# Patient Record
Sex: Female | Born: 1935 | Race: White | Hispanic: No
Health system: Southern US, Community
[De-identification: ages and names within clinical notes are randomized; demographics above are authoritative.]

---

## 2004-08-09 ENCOUNTER — Ambulatory Visit: Payer: Self-pay | Admitting: Family Medicine

## 2005-09-20 ENCOUNTER — Other Ambulatory Visit: Payer: Self-pay

## 2005-09-20 ENCOUNTER — Emergency Department: Payer: Self-pay | Admitting: Emergency Medicine

## 2006-02-12 ENCOUNTER — Ambulatory Visit: Payer: Self-pay | Admitting: Family Medicine

## 2007-06-26 ENCOUNTER — Ambulatory Visit: Payer: Self-pay | Admitting: Family Medicine

## 2009-05-04 ENCOUNTER — Ambulatory Visit: Payer: Self-pay | Admitting: Family Medicine

## 2011-05-01 ENCOUNTER — Inpatient Hospital Stay: Payer: Self-pay | Admitting: Specialist

## 2011-05-10 ENCOUNTER — Ambulatory Visit: Payer: Self-pay | Admitting: Oncology

## 2011-05-19 ENCOUNTER — Ambulatory Visit: Payer: Self-pay | Admitting: Oncology

## 2011-06-10 ENCOUNTER — Ambulatory Visit: Payer: Self-pay | Admitting: Oncology

## 2011-06-24 ENCOUNTER — Inpatient Hospital Stay: Payer: Self-pay | Admitting: Internal Medicine

## 2011-07-10 ENCOUNTER — Ambulatory Visit: Payer: Self-pay | Admitting: Oncology

## 2011-08-10 ENCOUNTER — Ambulatory Visit: Payer: Self-pay | Admitting: Oncology

## 2011-09-09 ENCOUNTER — Ambulatory Visit: Payer: Self-pay | Admitting: Oncology

## 2011-10-10 ENCOUNTER — Ambulatory Visit: Payer: Self-pay | Admitting: Oncology

## 2011-10-12 LAB — COMPREHENSIVE METABOLIC PANEL
Albumin: 4 g/dL (ref 3.4–5.0)
Alkaline Phosphatase: 82 U/L (ref 50–136)
BUN: 14 mg/dL (ref 7–18)
Calcium, Total: 8.9 mg/dL (ref 8.5–10.1)
Osmolality: 287 (ref 275–301)
Potassium: 4.1 mmol/L (ref 3.5–5.1)
SGOT(AST): 17 U/L (ref 15–37)
SGPT (ALT): 20 U/L
Sodium: 143 mmol/L (ref 136–145)
Total Protein: 7.1 g/dL (ref 6.4–8.2)

## 2011-10-12 LAB — CBC CANCER CENTER
Basophil #: 0 x10 3/mm (ref 0.0–0.1)
Basophil %: 0.7 %
HCT: 34.9 % — ABNORMAL LOW (ref 35.0–47.0)
HGB: 11.9 g/dL — ABNORMAL LOW (ref 12.0–16.0)
Lymphocyte %: 13.7 %
MCH: 32.3 pg (ref 26.0–34.0)
MCHC: 34.2 g/dL (ref 32.0–36.0)
Monocyte %: 4.7 %
Neutrophil #: 4.1 x10 3/mm (ref 1.4–6.5)
Platelet: 197 x10 3/mm (ref 150–440)
RBC: 3.69 10*6/uL — ABNORMAL LOW (ref 3.80–5.20)

## 2011-10-19 LAB — CBC CANCER CENTER
Basophil #: 0 x10 3/mm (ref 0.0–0.1)
Basophil %: 0.1 %
Eosinophil #: 0 x10 3/mm (ref 0.0–0.7)
Eosinophil %: 0 %
HCT: 35.4 % (ref 35.0–47.0)
HGB: 12.2 g/dL (ref 12.0–16.0)
Lymphocyte #: 0.4 x10 3/mm — ABNORMAL LOW (ref 1.0–3.6)
MCH: 32.2 pg (ref 26.0–34.0)
MCHC: 34.3 g/dL (ref 32.0–36.0)
Monocyte #: 0 x10 3/mm (ref 0.0–0.7)
Neutrophil #: 6.2 x10 3/mm (ref 1.4–6.5)
Neutrophil %: 93.4 %
RBC: 3.78 10*6/uL — ABNORMAL LOW (ref 3.80–5.20)
RDW: 15.9 % — ABNORMAL HIGH (ref 11.5–14.5)
WBC: 6.6 x10 3/mm (ref 3.6–11.0)

## 2011-10-26 LAB — BASIC METABOLIC PANEL
Anion Gap: 14 (ref 7–16)
Calcium, Total: 9.4 mg/dL (ref 8.5–10.1)
Chloride: 100 mmol/L (ref 98–107)
Co2: 25 mmol/L (ref 21–32)
Osmolality: 284 (ref 275–301)
Potassium: 3.9 mmol/L (ref 3.5–5.1)

## 2011-10-26 LAB — CBC CANCER CENTER
Basophil %: 0.2 %
Eosinophil #: 0 x10 3/mm (ref 0.0–0.7)
Eosinophil %: 0 %
HGB: 12.5 g/dL (ref 12.0–16.0)
Lymphocyte %: 6.7 %
MCH: 32.1 pg (ref 26.0–34.0)
MCHC: 34.3 g/dL (ref 32.0–36.0)
Monocyte #: 0.1 x10 3/mm (ref 0.0–0.7)
Monocyte %: 0.9 %
Neutrophil %: 92.2 %
RBC: 3.88 10*6/uL (ref 3.80–5.20)
WBC: 7 x10 3/mm (ref 3.6–11.0)

## 2011-10-31 LAB — PROT IMMUNOELECTROPHORES(ARMC)

## 2011-11-10 ENCOUNTER — Ambulatory Visit: Payer: Self-pay | Admitting: Oncology

## 2011-11-22 LAB — CBC CANCER CENTER
Basophil #: 0 x10 3/mm (ref 0.0–0.1)
Eosinophil #: 0 x10 3/mm (ref 0.0–0.7)
HGB: 12.4 g/dL (ref 12.0–16.0)
Lymphocyte #: 0.4 x10 3/mm — ABNORMAL LOW (ref 1.0–3.6)
Lymphocyte %: 7.5 %
MCHC: 33.8 g/dL (ref 32.0–36.0)
Monocyte #: 0.1 x10 3/mm (ref 0.0–0.7)
Monocyte %: 1.2 %
Neutrophil %: 91.1 %
RDW: 13.9 % (ref 11.5–14.5)
WBC: 5.8 x10 3/mm (ref 3.6–11.0)

## 2011-11-22 LAB — COMPREHENSIVE METABOLIC PANEL
Alkaline Phosphatase: 51 U/L (ref 50–136)
Bilirubin,Total: 0.4 mg/dL (ref 0.2–1.0)
Chloride: 105 mmol/L (ref 98–107)
Co2: 24 mmol/L (ref 21–32)
Creatinine: 0.95 mg/dL (ref 0.60–1.30)
EGFR (Non-African Amer.): >60
Osmolality: 287 (ref 275–301)
SGPT (ALT): 22 U/L
Sodium: 140 mmol/L (ref 136–145)

## 2011-11-23 LAB — PROT IMMUNOELECTROPHORES(ARMC)

## 2011-12-06 LAB — CBC CANCER CENTER
Basophil #: 0 x10 3/mm (ref 0.0–0.1)
Basophil %: 0.2 %
Eosinophil #: 0 x10 3/mm (ref 0.0–0.7)
HCT: 35.7 % (ref 35.0–47.0)
HGB: 12.3 g/dL (ref 12.0–16.0)
Lymphocyte #: 0.7 x10 3/mm — ABNORMAL LOW (ref 1.0–3.6)
Lymphocyte %: 8.9 %
MCH: 31.9 pg (ref 26.0–34.0)
MCV: 93 fL (ref 80–100)
Monocyte #: 0.1 x10 3/mm (ref 0.0–0.7)
Monocyte %: 1.2 %
Neutrophil %: 89.5 %
Platelet: 178 x10 3/mm (ref 150–440)
RBC: 3.84 10*6/uL (ref 3.80–5.20)

## 2011-12-08 ENCOUNTER — Ambulatory Visit: Payer: Self-pay | Admitting: Oncology

## 2011-12-20 LAB — CBC CANCER CENTER
Basophil %: 0.1 %
Eosinophil %: 0.2 %
HCT: 35.5 % (ref 35.0–47.0)
Lymphocyte #: 0.6 x10 3/mm — ABNORMAL LOW (ref 1.0–3.6)
MCH: 32.1 pg (ref 26.0–34.0)
MCV: 93 fL (ref 80–100)
Monocyte #: 0.1 x10 3/mm (ref 0.0–0.7)
Monocyte %: 1.6 %
Neutrophil #: 6.6 x10 3/mm — ABNORMAL HIGH (ref 1.4–6.5)
Platelet: 178 x10 3/mm (ref 150–440)
RBC: 3.83 10*6/uL (ref 3.80–5.20)
RDW: 13.9 % (ref 11.5–14.5)

## 2011-12-20 LAB — COMPREHENSIVE METABOLIC PANEL
Alkaline Phosphatase: 64 U/L (ref 50–136)
Anion Gap: 11 (ref 7–16)
BUN: 21 mg/dL — ABNORMAL HIGH (ref 7–18)
Co2: 25 mmol/L (ref 21–32)
Creatinine: 1.1 mg/dL (ref 0.60–1.30)
EGFR (African American): >60
EGFR (Non-African Amer.): 51 — ABNORMAL LOW
SGOT(AST): 22 U/L (ref 15–37)
SGPT (ALT): 28 U/L
Sodium: 138 mmol/L (ref 136–145)
Total Protein: 7.9 g/dL (ref 6.4–8.2)

## 2012-01-08 ENCOUNTER — Ambulatory Visit: Payer: Self-pay | Admitting: Oncology

## 2012-01-15 ENCOUNTER — Ambulatory Visit: Payer: Self-pay | Admitting: Family Medicine

## 2012-01-17 ENCOUNTER — Ambulatory Visit: Payer: Self-pay | Admitting: Oncology

## 2012-01-17 LAB — CBC CANCER CENTER
Basophil %: 0.2 %
Eosinophil #: 0 x10 3/mm (ref 0.0–0.7)
Eosinophil %: 0.2 %
HCT: 33.1 % — ABNORMAL LOW (ref 35.0–47.0)
HGB: 11.3 g/dL — ABNORMAL LOW (ref 12.0–16.0)
Lymphocyte #: 0.7 x10 3/mm — ABNORMAL LOW (ref 1.0–3.6)
MCV: 93 fL (ref 80–100)
Monocyte %: 1.9 %
Neutrophil #: 6.2 x10 3/mm (ref 1.4–6.5)
Neutrophil %: 88 %
RBC: 3.54 10*6/uL — ABNORMAL LOW (ref 3.80–5.20)
RDW: 14 % (ref 11.5–14.5)

## 2012-01-17 LAB — COMPREHENSIVE METABOLIC PANEL
Alkaline Phosphatase: 78 U/L (ref 50–136)
Anion Gap: 14 (ref 7–16)
Calcium, Total: 8.9 mg/dL (ref 8.5–10.1)
Chloride: 104 mmol/L (ref 98–107)
Co2: 21 mmol/L (ref 21–32)
Creatinine: 1.22 mg/dL (ref 0.60–1.30)
EGFR (African American): 55 — ABNORMAL LOW
EGFR (Non-African Amer.): 46 — ABNORMAL LOW
Glucose: 154 mg/dL — ABNORMAL HIGH (ref 65–99)
Osmolality: 285 (ref 275–301)
SGOT(AST): 22 U/L (ref 15–37)
SGPT (ALT): 25 U/L
Total Protein: 8.2 g/dL (ref 6.4–8.2)

## 2012-01-18 LAB — PROT IMMUNOELECTROPHORES(ARMC)

## 2012-01-18 LAB — KAPPA/LAMBDA FREE LIGHT CHAINS (ARMC)

## 2012-01-22 LAB — PROT IMMUNOELECT,UR-24HR

## 2012-02-07 ENCOUNTER — Ambulatory Visit: Payer: Self-pay | Admitting: Oncology

## 2012-02-15 LAB — BASIC METABOLIC PANEL
Anion Gap: 12 (ref 7–16)
BUN: 29 mg/dL — ABNORMAL HIGH (ref 7–18)
Calcium, Total: 8.1 mg/dL — ABNORMAL LOW (ref 8.5–10.1)
Creatinine: 1.78 mg/dL — ABNORMAL HIGH (ref 0.60–1.30)
EGFR (African American): 32 — ABNORMAL LOW
Glucose: 101 mg/dL — ABNORMAL HIGH (ref 65–99)
Osmolality: 284 (ref 275–301)

## 2012-02-15 LAB — CBC CANCER CENTER
Basophil #: 0 x10 3/mm (ref 0.0–0.1)
Basophil %: 0.6 %
HCT: 29.7 % — ABNORMAL LOW (ref 35.0–47.0)
MCH: 31.5 pg (ref 26.0–34.0)
Monocyte %: 10.3 %
Neutrophil #: 2.5 x10 3/mm (ref 1.4–6.5)
RDW: 14.6 % — ABNORMAL HIGH (ref 11.5–14.5)
WBC: 4.7 x10 3/mm (ref 3.6–11.0)

## 2012-02-20 LAB — BASIC METABOLIC PANEL
Anion Gap: 12 (ref 7–16)
BUN: 26 mg/dL — ABNORMAL HIGH (ref 7–18)
Calcium, Total: 8.7 mg/dL (ref 8.5–10.1)
Co2: 24 mmol/L (ref 21–32)
Creatinine: 1.58 mg/dL — ABNORMAL HIGH (ref 0.60–1.30)
EGFR (African American): 37 — ABNORMAL LOW
Osmolality: 285 (ref 275–301)
Sodium: 140 mmol/L (ref 136–145)

## 2012-02-20 LAB — CBC CANCER CENTER
Basophil #: 0 x10 3/mm (ref 0.0–0.1)
Eosinophil #: 0.1 x10 3/mm (ref 0.0–0.7)
Lymphocyte #: 1.2 x10 3/mm (ref 1.0–3.6)
Lymphocyte %: 27.3 %
MCH: 31.7 pg (ref 26.0–34.0)
Monocyte #: 0.5 x10 3/mm (ref 0.2–0.9)
Monocyte %: 10.5 %
Neutrophil #: 2.6 x10 3/mm (ref 1.4–6.5)
RBC: 3.27 10*6/uL — ABNORMAL LOW (ref 3.80–5.20)
WBC: 4.4 x10 3/mm (ref 3.6–11.0)

## 2012-02-27 LAB — CBC CANCER CENTER
Eosinophil #: 0.1 x10 3/mm (ref 0.0–0.7)
HCT: 29.4 % — ABNORMAL LOW (ref 35.0–47.0)
HGB: 9.8 g/dL — ABNORMAL LOW (ref 12.0–16.0)
Lymphocyte #: 1.5 x10 3/mm (ref 1.0–3.6)
MCH: 31.5 pg (ref 26.0–34.0)
MCHC: 33.4 g/dL (ref 32.0–36.0)
MCV: 94 fL (ref 80–100)
Monocyte %: 11.4 %
Neutrophil #: 2.3 x10 3/mm (ref 1.4–6.5)
Neutrophil %: 52.3 %
RBC: 3.12 10*6/uL — ABNORMAL LOW (ref 3.80–5.20)
RDW: 14.7 % — ABNORMAL HIGH (ref 11.5–14.5)
WBC: 4.5 x10 3/mm (ref 3.6–11.0)

## 2012-03-05 LAB — CBC CANCER CENTER
Basophil #: 0 x10 3/mm (ref 0.0–0.1)
Eosinophil #: 0.1 x10 3/mm (ref 0.0–0.7)
Eosinophil %: 1.2 %
HCT: 30.4 % — ABNORMAL LOW (ref 35.0–47.0)
HGB: 10.2 g/dL — ABNORMAL LOW (ref 12.0–16.0)
Lymphocyte %: 14.1 %
MCH: 31.8 pg (ref 26.0–34.0)
MCHC: 33.6 g/dL (ref 32.0–36.0)
Platelet: 128 x10 3/mm — ABNORMAL LOW (ref 150–440)
RDW: 15 % — ABNORMAL HIGH (ref 11.5–14.5)

## 2012-03-05 LAB — BASIC METABOLIC PANEL
Anion Gap: 11 (ref 7–16)
BUN: 25 mg/dL — ABNORMAL HIGH (ref 7–18)
Calcium, Total: 9.1 mg/dL (ref 8.5–10.1)
Chloride: 104 mmol/L (ref 98–107)
Creatinine: 1.27 mg/dL (ref 0.60–1.30)
EGFR (African American): 48 — ABNORMAL LOW
EGFR (Non-African Amer.): 41 — ABNORMAL LOW
Sodium: 139 mmol/L (ref 136–145)

## 2012-03-09 ENCOUNTER — Ambulatory Visit: Payer: Self-pay | Admitting: Oncology

## 2012-03-12 LAB — CBC CANCER CENTER
Basophil %: 0.3 %
Eosinophil #: 0 x10 3/mm (ref 0.0–0.7)
HCT: 30.3 % — ABNORMAL LOW (ref 35.0–47.0)
Lymphocyte #: 0.6 x10 3/mm — ABNORMAL LOW (ref 1.0–3.6)
MCHC: 33.5 g/dL (ref 32.0–36.0)
MCV: 96 fL (ref 80–100)
Platelet: 148 x10 3/mm — ABNORMAL LOW (ref 150–440)
RBC: 3.15 10*6/uL — ABNORMAL LOW (ref 3.80–5.20)

## 2012-03-19 LAB — CBC CANCER CENTER
Basophil #: 0 x10 3/mm (ref 0.0–0.1)
Basophil %: 0.4 %
Eosinophil #: 0 x10 3/mm (ref 0.0–0.7)
Eosinophil %: 0.7 %
HCT: 30.8 % — ABNORMAL LOW (ref 35.0–47.0)
HGB: 10.4 g/dL — ABNORMAL LOW (ref 12.0–16.0)
Lymphocyte #: 0.5 x10 3/mm — ABNORMAL LOW (ref 1.0–3.6)
Lymphocyte %: 9.9 %
MCH: 32.5 pg (ref 26.0–34.0)
MCHC: 33.7 g/dL (ref 32.0–36.0)
MCV: 97 fL (ref 80–100)
Monocyte #: 0.1 x10 3/mm — ABNORMAL LOW (ref 0.2–0.9)
Neutrophil #: 4.6 x10 3/mm (ref 1.4–6.5)
RDW: 15.7 % — ABNORMAL HIGH (ref 11.5–14.5)
WBC: 5.3 x10 3/mm (ref 3.6–11.0)

## 2012-03-19 LAB — BASIC METABOLIC PANEL
BUN: 26 mg/dL — ABNORMAL HIGH (ref 7–18)
Calcium, Total: 9.6 mg/dL (ref 8.5–10.1)
Co2: 22 mmol/L (ref 21–32)
EGFR (African American): 47 — ABNORMAL LOW
EGFR (Non-African Amer.): 41 — ABNORMAL LOW
Sodium: 138 mmol/L (ref 136–145)

## 2012-03-26 LAB — CBC CANCER CENTER
Basophil #: 0 x10 3/mm (ref 0.0–0.1)
Eosinophil #: 0 x10 3/mm (ref 0.0–0.7)
Lymphocyte %: 9.6 %
MCHC: 33.4 g/dL (ref 32.0–36.0)
MCV: 97 fL (ref 80–100)
Monocyte %: 1.7 %
Neutrophil #: 5.6 x10 3/mm (ref 1.4–6.5)
Platelet: 167 x10 3/mm (ref 150–440)
RBC: 3.19 10*6/uL — ABNORMAL LOW (ref 3.80–5.20)
WBC: 6.3 x10 3/mm (ref 3.6–11.0)

## 2012-04-01 LAB — CBC CANCER CENTER
Basophil #: 0 x10 3/mm (ref 0.0–0.1)
Basophil %: 0.5 %
HCT: 30.4 % — ABNORMAL LOW (ref 35.0–47.0)
HGB: 9.9 g/dL — ABNORMAL LOW (ref 12.0–16.0)
MCHC: 32.7 g/dL (ref 32.0–36.0)
Monocyte %: 11.8 %
Neutrophil %: 59.8 %
RDW: 15.6 % — ABNORMAL HIGH (ref 11.5–14.5)
WBC: 4.6 x10 3/mm (ref 3.6–11.0)

## 2012-04-01 LAB — COMPREHENSIVE METABOLIC PANEL
Alkaline Phosphatase: 98 U/L (ref 50–136)
BUN: 30 mg/dL — ABNORMAL HIGH (ref 7–18)
Bilirubin,Total: 0.4 mg/dL (ref 0.2–1.0)
Calcium, Total: 9.2 mg/dL (ref 8.5–10.1)
EGFR (African American): 51 — ABNORMAL LOW
Glucose: 128 mg/dL — ABNORMAL HIGH (ref 65–99)
Potassium: 4.1 mmol/L (ref 3.5–5.1)
SGOT(AST): 19 U/L (ref 15–37)
Total Protein: 7 g/dL (ref 6.4–8.2)

## 2012-04-08 ENCOUNTER — Ambulatory Visit: Payer: Self-pay | Admitting: Oncology

## 2012-04-16 LAB — COMPREHENSIVE METABOLIC PANEL
Albumin: 4.2 g/dL (ref 3.4–5.0)
Anion Gap: 13 (ref 7–16)
Calcium, Total: 9.8 mg/dL (ref 8.5–10.1)
Chloride: 103 mmol/L (ref 98–107)
Creatinine: 1.49 mg/dL — ABNORMAL HIGH (ref 0.60–1.30)
EGFR (African American): 39 — ABNORMAL LOW
Potassium: 4.2 mmol/L (ref 3.5–5.1)
SGOT(AST): 25 U/L (ref 15–37)
SGPT (ALT): 32 U/L

## 2012-04-16 LAB — CBC CANCER CENTER
Eosinophil #: 0 x10 3/mm (ref 0.0–0.7)
Eosinophil %: 0.3 %
HCT: 30.5 % — ABNORMAL LOW (ref 35.0–47.0)
HGB: 10 g/dL — ABNORMAL LOW (ref 12.0–16.0)
Lymphocyte #: 0.6 x10 3/mm — ABNORMAL LOW (ref 1.0–3.6)
Monocyte #: 0.1 x10 3/mm — ABNORMAL LOW (ref 0.2–0.9)
Monocyte %: 2 %
Neutrophil %: 85.2 %
Platelet: 189 x10 3/mm (ref 150–440)
RBC: 3.11 10*6/uL — ABNORMAL LOW (ref 3.80–5.20)
WBC: 4.7 x10 3/mm (ref 3.6–11.0)

## 2012-04-23 LAB — CBC CANCER CENTER
Basophil #: 0 x10 3/mm (ref 0.0–0.1)
Basophil %: 0.3 %
Eosinophil %: 0.4 %
HCT: 31.1 % — ABNORMAL LOW (ref 35.0–47.0)
HGB: 10.2 g/dL — ABNORMAL LOW (ref 12.0–16.0)
Lymphocyte #: 0.7 x10 3/mm — ABNORMAL LOW (ref 1.0–3.6)
Lymphocyte %: 12.2 %
MCV: 98 fL (ref 80–100)
Monocyte %: 2.2 %
Platelet: 174 x10 3/mm (ref 150–440)
RBC: 3.17 10*6/uL — ABNORMAL LOW (ref 3.80–5.20)
RDW: 16.2 % — ABNORMAL HIGH (ref 11.5–14.5)
WBC: 6 x10 3/mm (ref 3.6–11.0)

## 2012-04-24 LAB — KAPPA/LAMBDA FREE LIGHT CHAINS (ARMC)

## 2012-04-30 LAB — CBC CANCER CENTER
Basophil #: 0 x10 3/mm (ref 0.0–0.1)
Basophil %: 0.2 %
HCT: 32.8 % — ABNORMAL LOW (ref 35.0–47.0)
HGB: 10.8 g/dL — ABNORMAL LOW (ref 12.0–16.0)
Lymphocyte #: 0.7 x10 3/mm — ABNORMAL LOW (ref 1.0–3.6)
MCH: 32.3 pg (ref 26.0–34.0)
MCHC: 32.9 g/dL (ref 32.0–36.0)
MCV: 98 fL (ref 80–100)
Monocyte #: 0.1 x10 3/mm — ABNORMAL LOW (ref 0.2–0.9)
Monocyte %: 1.8 %
Neutrophil #: 5.6 x10 3/mm (ref 1.4–6.5)
Platelet: 155 x10 3/mm (ref 150–440)
RDW: 16.6 % — ABNORMAL HIGH (ref 11.5–14.5)

## 2012-04-30 LAB — BASIC METABOLIC PANEL
Anion Gap: 11 (ref 7–16)
BUN: 27 mg/dL — ABNORMAL HIGH (ref 7–18)
Chloride: 105 mmol/L (ref 98–107)
EGFR (African American): 48 — ABNORMAL LOW
EGFR (Non-African Amer.): 41 — ABNORMAL LOW
Glucose: 160 mg/dL — ABNORMAL HIGH (ref 65–99)
Osmolality: 284 (ref 275–301)
Potassium: 4.4 mmol/L (ref 3.5–5.1)
Sodium: 138 mmol/L (ref 136–145)

## 2012-05-09 ENCOUNTER — Ambulatory Visit: Payer: Self-pay | Admitting: Oncology

## 2012-05-14 LAB — COMPREHENSIVE METABOLIC PANEL
Albumin: 4.1 g/dL (ref 3.4–5.0)
Alkaline Phosphatase: 67 U/L (ref 50–136)
BUN: 28 mg/dL — ABNORMAL HIGH (ref 7–18)
Calcium, Total: 9 mg/dL (ref 8.5–10.1)
Co2: 26 mmol/L (ref 21–32)
Creatinine: 1.43 mg/dL — ABNORMAL HIGH (ref 0.60–1.30)
EGFR (Non-African Amer.): 35 — ABNORMAL LOW
Glucose: 145 mg/dL — ABNORMAL HIGH (ref 65–99)
Osmolality: 289 (ref 275–301)
SGOT(AST): 30 U/L (ref 15–37)
SGPT (ALT): 24 U/L (ref 12–78)
Total Protein: 7.6 g/dL (ref 6.4–8.2)

## 2012-05-14 LAB — CBC CANCER CENTER
Basophil #: 0 x10 3/mm (ref 0.0–0.1)
Basophil %: 0.3 %
Eosinophil #: 0 x10 3/mm (ref 0.0–0.7)
HCT: 30.6 % — ABNORMAL LOW (ref 35.0–47.0)
HGB: 10.4 g/dL — ABNORMAL LOW (ref 12.0–16.0)
Lymphocyte #: 0.4 x10 3/mm — ABNORMAL LOW (ref 1.0–3.6)
Lymphocyte %: 7.6 %
MCH: 33.3 pg (ref 26.0–34.0)
MCHC: 34.1 g/dL (ref 32.0–36.0)
MCV: 98 fL (ref 80–100)
Monocyte #: 0.1 x10 3/mm — ABNORMAL LOW (ref 0.2–0.9)
Neutrophil #: 5.2 x10 3/mm (ref 1.4–6.5)
RDW: 16.2 % — ABNORMAL HIGH (ref 11.5–14.5)
WBC: 5.8 x10 3/mm (ref 3.6–11.0)

## 2012-05-21 LAB — CBC CANCER CENTER
Basophil %: 0.2 %
Eosinophil %: 0.2 %
HGB: 9.6 g/dL — ABNORMAL LOW (ref 12.0–16.0)
Lymphocyte #: 0.5 x10 3/mm — ABNORMAL LOW (ref 1.0–3.6)
Lymphocyte %: 8.1 %
MCH: 32.2 pg (ref 26.0–34.0)
MCV: 99 fL (ref 80–100)
Monocyte #: 0.1 x10 3/mm — ABNORMAL LOW (ref 0.2–0.9)
Platelet: 137 x10 3/mm — ABNORMAL LOW (ref 150–440)
RBC: 2.99 10*6/uL — ABNORMAL LOW (ref 3.80–5.20)

## 2012-05-28 LAB — CBC CANCER CENTER
Basophil #: 0 x10 3/mm (ref 0.0–0.1)
Basophil %: 0.2 %
Eosinophil #: 0 x10 3/mm (ref 0.0–0.7)
HGB: 10.4 g/dL — ABNORMAL LOW (ref 12.0–16.0)
Lymphocyte #: 0.5 x10 3/mm — ABNORMAL LOW (ref 1.0–3.6)
Lymphocyte %: 8.3 %
MCH: 32.5 pg (ref 26.0–34.0)
MCHC: 32.9 g/dL (ref 32.0–36.0)
MCV: 99 fL (ref 80–100)
Monocyte #: 0.1 x10 3/mm — ABNORMAL LOW (ref 0.2–0.9)
Neutrophil %: 89.6 %
Platelet: 150 x10 3/mm (ref 150–440)
RDW: 16.3 % — ABNORMAL HIGH (ref 11.5–14.5)

## 2012-06-09 ENCOUNTER — Ambulatory Visit: Payer: Self-pay | Admitting: Oncology

## 2012-06-11 LAB — COMPREHENSIVE METABOLIC PANEL
Alkaline Phosphatase: 77 U/L (ref 50–136)
Anion Gap: 11 (ref 7–16)
Bilirubin,Total: 0.4 mg/dL (ref 0.2–1.0)
Calcium, Total: 9.6 mg/dL (ref 8.5–10.1)
Chloride: 105 mmol/L (ref 98–107)
Co2: 25 mmol/L (ref 21–32)
EGFR (African American): 46 — ABNORMAL LOW
EGFR (Non-African Amer.): 40 — ABNORMAL LOW
Osmolality: 287 (ref 275–301)
SGOT(AST): 28 U/L (ref 15–37)
SGPT (ALT): 30 U/L (ref 12–78)
Sodium: 141 mmol/L (ref 136–145)
Total Protein: 7.7 g/dL (ref 6.4–8.2)

## 2012-06-11 LAB — CBC CANCER CENTER
Basophil %: 0.6 %
Eosinophil %: 0.4 %
HCT: 31.5 % — ABNORMAL LOW (ref 35.0–47.0)
HGB: 10.3 g/dL — ABNORMAL LOW (ref 12.0–16.0)
Lymphocyte %: 8.6 %
MCH: 32.1 pg (ref 26.0–34.0)
MCHC: 32.6 g/dL (ref 32.0–36.0)
MCV: 98 fL (ref 80–100)
Monocyte #: 0.2 x10 3/mm (ref 0.2–0.9)
Neutrophil #: 5.6 x10 3/mm (ref 1.4–6.5)
Neutrophil %: 87.8 %
RBC: 3.2 10*6/uL — ABNORMAL LOW (ref 3.80–5.20)
RDW: 15.4 % — ABNORMAL HIGH (ref 11.5–14.5)

## 2012-06-14 LAB — KAPPA/LAMBDA FREE LIGHT CHAINS (ARMC)

## 2012-06-14 LAB — PROT IMMUNOELECTROPHORES(ARMC)

## 2012-06-18 LAB — CBC CANCER CENTER
Basophil #: 0 x10 3/mm (ref 0.0–0.1)
Eosinophil %: 0.1 %
HCT: 30.7 % — ABNORMAL LOW (ref 35.0–47.0)
HGB: 10.2 g/dL — ABNORMAL LOW (ref 12.0–16.0)
Lymphocyte %: 6.6 %
MCH: 32.4 pg (ref 26.0–34.0)
MCHC: 33.2 g/dL (ref 32.0–36.0)
MCV: 97 fL (ref 80–100)
Monocyte %: 1 %
Neutrophil #: 5 x10 3/mm (ref 1.4–6.5)
Neutrophil %: 92.1 %
Platelet: 150 x10 3/mm (ref 150–440)
RBC: 3.15 10*6/uL — ABNORMAL LOW (ref 3.80–5.20)

## 2012-06-25 LAB — CBC CANCER CENTER
Basophil #: 0 x10 3/mm (ref 0.0–0.1)
Basophil %: 0.3 %
Eosinophil %: 0.2 %
HCT: 31.9 % — ABNORMAL LOW (ref 35.0–47.0)
HGB: 10.5 g/dL — ABNORMAL LOW (ref 12.0–16.0)
Lymphocyte #: 0.4 x10 3/mm — ABNORMAL LOW (ref 1.0–3.6)
Lymphocyte %: 8.2 %
MCH: 32 pg (ref 26.0–34.0)
MCV: 98 fL (ref 80–100)
Monocyte #: 0.1 x10 3/mm — ABNORMAL LOW (ref 0.2–0.9)
Neutrophil #: 4.3 x10 3/mm (ref 1.4–6.5)
Neutrophil %: 89.6 %
RBC: 3.26 10*6/uL — ABNORMAL LOW (ref 3.80–5.20)
WBC: 4.8 x10 3/mm (ref 3.6–11.0)

## 2012-07-02 LAB — COMPREHENSIVE METABOLIC PANEL
Alkaline Phosphatase: 82 U/L (ref 50–136)
BUN: 19 mg/dL — ABNORMAL HIGH (ref 7–18)
Calcium, Total: 9.2 mg/dL (ref 8.5–10.1)
Co2: 26 mmol/L (ref 21–32)
Osmolality: 287 (ref 275–301)
Potassium: 4.1 mmol/L (ref 3.5–5.1)
SGOT(AST): 25 U/L (ref 15–37)
SGPT (ALT): 27 U/L (ref 12–78)
Sodium: 140 mmol/L (ref 136–145)
Total Protein: 7.5 g/dL (ref 6.4–8.2)

## 2012-07-02 LAB — CBC CANCER CENTER
Basophil %: 0.3 %
Eosinophil #: 0 x10 3/mm (ref 0.0–0.7)
HGB: 10.5 g/dL — ABNORMAL LOW (ref 12.0–16.0)
Lymphocyte #: 0.4 x10 3/mm — ABNORMAL LOW (ref 1.0–3.6)
Lymphocyte %: 6.9 %
MCH: 32 pg (ref 26.0–34.0)
Monocyte #: 0.1 x10 3/mm — ABNORMAL LOW (ref 0.2–0.9)
Monocyte %: 1.9 %
Neutrophil #: 5.4 x10 3/mm (ref 1.4–6.5)
Platelet: 158 x10 3/mm (ref 150–440)
RBC: 3.28 10*6/uL — ABNORMAL LOW (ref 3.80–5.20)
WBC: 5.9 x10 3/mm (ref 3.6–11.0)

## 2012-07-09 ENCOUNTER — Ambulatory Visit: Payer: Self-pay | Admitting: Oncology

## 2012-08-13 ENCOUNTER — Ambulatory Visit: Payer: Self-pay | Admitting: Oncology

## 2012-08-13 LAB — CBC CANCER CENTER
Basophil #: 0 x10 3/mm (ref 0.0–0.1)
Eosinophil #: 0.1 x10 3/mm (ref 0.0–0.7)
Eosinophil %: 2.3 %
Lymphocyte #: 1.4 x10 3/mm (ref 1.0–3.6)
MCH: 31.6 pg (ref 26.0–34.0)
MCHC: 33.2 g/dL (ref 32.0–36.0)
MCV: 95 fL (ref 80–100)
Monocyte #: 0.5 x10 3/mm (ref 0.2–0.9)
Neutrophil #: 2.4 x10 3/mm (ref 1.4–6.5)
Platelet: 147 x10 3/mm — ABNORMAL LOW (ref 150–440)
RBC: 3.27 10*6/uL — ABNORMAL LOW (ref 3.80–5.20)
RDW: 15 % — ABNORMAL HIGH (ref 11.5–14.5)
WBC: 4.4 x10 3/mm (ref 3.6–11.0)

## 2012-08-13 LAB — COMPREHENSIVE METABOLIC PANEL
Alkaline Phosphatase: 60 U/L (ref 50–136)
Bilirubin,Total: 0.3 mg/dL (ref 0.2–1.0)
Calcium, Total: 9.9 mg/dL (ref 8.5–10.1)
Chloride: 103 mmol/L (ref 98–107)
Co2: 25 mmol/L (ref 21–32)
Creatinine: 1.19 mg/dL (ref 0.60–1.30)
EGFR (African American): 51 — ABNORMAL LOW
EGFR (Non-African Amer.): 44 — ABNORMAL LOW
SGOT(AST): 23 U/L (ref 15–37)
SGPT (ALT): 22 U/L (ref 12–78)

## 2012-08-14 LAB — PROT IMMUNOELECTROPHORES(ARMC)

## 2012-09-08 ENCOUNTER — Ambulatory Visit: Payer: Self-pay | Admitting: Oncology

## 2012-10-07 LAB — CBC CANCER CENTER
Basophil #: 0 x10 3/mm (ref 0.0–0.1)
Lymphocyte #: 1.7 x10 3/mm (ref 1.0–3.6)
Lymphocyte %: 27.2 %
Monocyte #: 0.7 x10 3/mm (ref 0.2–0.9)
Neutrophil %: 58.5 %
Platelet: 145 x10 3/mm — ABNORMAL LOW (ref 150–440)
RBC: 2.85 10*6/uL — ABNORMAL LOW (ref 3.80–5.20)
WBC: 6.3 x10 3/mm (ref 3.6–11.0)

## 2012-10-07 LAB — COMPREHENSIVE METABOLIC PANEL
BUN: 32 mg/dL — ABNORMAL HIGH (ref 7–18)
Bilirubin,Total: 0.3 mg/dL (ref 0.2–1.0)
Creatinine: 2.34 mg/dL — ABNORMAL HIGH (ref 0.60–1.30)
EGFR (African American): 23 — ABNORMAL LOW
EGFR (Non-African Amer.): 20 — ABNORMAL LOW
Glucose: 115 mg/dL — ABNORMAL HIGH (ref 65–99)
Osmolality: 289 (ref 275–301)
Potassium: 4.2 mmol/L (ref 3.5–5.1)
SGOT(AST): 25 U/L (ref 15–37)
SGPT (ALT): 24 U/L (ref 12–78)

## 2012-10-08 LAB — PROT IMMUNOELECTROPHORES(ARMC)

## 2012-10-09 ENCOUNTER — Ambulatory Visit: Payer: Self-pay | Admitting: Oncology

## 2012-10-10 LAB — CBC CANCER CENTER
Basophil %: 1.1 %
Eosinophil %: 2.2 %
HCT: 27 % — ABNORMAL LOW (ref 35.0–47.0)
HGB: 9.3 g/dL — ABNORMAL LOW (ref 12.0–16.0)
Lymphocyte #: 1.7 x10 3/mm (ref 1.0–3.6)
Lymphocyte %: 28 %
MCH: 31.9 pg (ref 26.0–34.0)
MCHC: 34.5 g/dL (ref 32.0–36.0)
MCV: 93 fL (ref 80–100)
Monocyte %: 12.7 %
Neutrophil #: 3.4 x10 3/mm (ref 1.4–6.5)
Neutrophil %: 56 %
RDW: 15.8 % — ABNORMAL HIGH (ref 11.5–14.5)
WBC: 6 x10 3/mm (ref 3.6–11.0)

## 2012-10-10 LAB — BASIC METABOLIC PANEL
Anion Gap: 12 (ref 7–16)
BUN: 30 mg/dL — ABNORMAL HIGH (ref 7–18)
Calcium, Total: 9.4 mg/dL (ref 8.5–10.1)
Co2: 23 mmol/L (ref 21–32)
EGFR (African American): 31 — ABNORMAL LOW
EGFR (Non-African Amer.): 27 — ABNORMAL LOW
Osmolality: 287 (ref 275–301)
Potassium: 4 mmol/L (ref 3.5–5.1)

## 2012-10-17 LAB — CBC CANCER CENTER
Basophil #: 0 x10 3/mm (ref 0.0–0.1)
Eosinophil #: 0.1 x10 3/mm (ref 0.0–0.7)
HCT: 24.6 % — ABNORMAL LOW (ref 35.0–47.0)
HGB: 8.4 g/dL — ABNORMAL LOW (ref 12.0–16.0)
Lymphocyte #: 1.9 x10 3/mm (ref 1.0–3.6)
MCHC: 34.3 g/dL (ref 32.0–36.0)
Monocyte #: 0.6 x10 3/mm (ref 0.2–0.9)
Monocyte %: 13.2 %
Neutrophil #: 2.2 x10 3/mm (ref 1.4–6.5)
Neutrophil %: 45.5 %
Platelet: 123 x10 3/mm — ABNORMAL LOW (ref 150–440)
RBC: 2.62 10*6/uL — ABNORMAL LOW (ref 3.80–5.20)
RDW: 16.5 % — ABNORMAL HIGH (ref 11.5–14.5)
WBC: 4.8 x10 3/mm (ref 3.6–11.0)

## 2012-10-24 LAB — BASIC METABOLIC PANEL
Calcium, Total: 7.9 mg/dL — ABNORMAL LOW (ref 8.5–10.1)
Chloride: 104 mmol/L (ref 98–107)
Creatinine: 1.22 mg/dL (ref 0.60–1.30)
EGFR (African American): 50 — ABNORMAL LOW
EGFR (Non-African Amer.): 43 — ABNORMAL LOW
Osmolality: 279 (ref 275–301)
Potassium: 3.9 mmol/L (ref 3.5–5.1)
Sodium: 138 mmol/L (ref 136–145)

## 2012-10-24 LAB — CBC CANCER CENTER
Basophil #: 0 x10 3/mm (ref 0.0–0.1)
Eosinophil %: 1.8 %
HGB: 8.4 g/dL — ABNORMAL LOW (ref 12.0–16.0)
Lymphocyte #: 1.4 x10 3/mm (ref 1.0–3.6)
Lymphocyte %: 31.3 %
MCHC: 34 g/dL (ref 32.0–36.0)
MCV: 95 fL (ref 80–100)
Monocyte #: 0.6 x10 3/mm (ref 0.2–0.9)
Neutrophil #: 2.3 x10 3/mm (ref 1.4–6.5)
Neutrophil %: 52.3 %
RDW: 17.2 % — ABNORMAL HIGH (ref 11.5–14.5)
WBC: 4.3 x10 3/mm (ref 3.6–11.0)

## 2012-11-07 LAB — CBC CANCER CENTER
Basophil #: 0 x10 3/mm (ref 0.0–0.1)
Basophil %: 0.6 %
Eosinophil %: 2 %
HCT: 25.5 % — ABNORMAL LOW (ref 35.0–47.0)
HGB: 8.5 g/dL — ABNORMAL LOW (ref 12.0–16.0)
Lymphocyte #: 1.2 x10 3/mm (ref 1.0–3.6)
MCH: 31.9 pg (ref 26.0–34.0)
MCHC: 33.6 g/dL (ref 32.0–36.0)
MCV: 95 fL (ref 80–100)
Monocyte %: 14 %
Neutrophil #: 2.3 x10 3/mm (ref 1.4–6.5)
Neutrophil %: 55.3 %
RBC: 2.68 10*6/uL — ABNORMAL LOW (ref 3.80–5.20)
WBC: 4.1 x10 3/mm (ref 3.6–11.0)

## 2012-11-07 LAB — COMPREHENSIVE METABOLIC PANEL
Alkaline Phosphatase: 72 U/L (ref 50–136)
Anion Gap: 12 (ref 7–16)
BUN: 31 mg/dL — ABNORMAL HIGH (ref 7–18)
Bilirubin,Total: 0.4 mg/dL (ref 0.2–1.0)
Chloride: 103 mmol/L (ref 98–107)
Creatinine: 1.17 mg/dL (ref 0.60–1.30)
EGFR (African American): 52 — ABNORMAL LOW
EGFR (Non-African Amer.): 45 — ABNORMAL LOW
Glucose: 111 mg/dL — ABNORMAL HIGH (ref 65–99)
Potassium: 3.9 mmol/L (ref 3.5–5.1)
SGPT (ALT): 23 U/L (ref 12–78)
Sodium: 139 mmol/L (ref 136–145)
Total Protein: 7.1 g/dL (ref 6.4–8.2)

## 2012-11-09 ENCOUNTER — Ambulatory Visit: Payer: Self-pay | Admitting: Oncology

## 2012-11-14 LAB — CBC CANCER CENTER
Basophil %: 0.4 %
Eosinophil #: 0.1 x10 3/mm (ref 0.0–0.7)
Eosinophil %: 1.2 %
HCT: 26 % — ABNORMAL LOW (ref 35.0–47.0)
HGB: 8.9 g/dL — ABNORMAL LOW (ref 12.0–16.0)
Lymphocyte #: 1.2 x10 3/mm (ref 1.0–3.6)
Lymphocyte %: 26.2 %
MCH: 33 pg (ref 26.0–34.0)
MCV: 97 fL (ref 80–100)
Neutrophil #: 2.6 x10 3/mm (ref 1.4–6.5)
RBC: 2.69 10*6/uL — ABNORMAL LOW (ref 3.80–5.20)
RDW: 18.8 % — ABNORMAL HIGH (ref 11.5–14.5)

## 2012-11-28 LAB — CBC CANCER CENTER
Basophil #: 0 x10 3/mm (ref 0.0–0.1)
Eosinophil #: 0.1 x10 3/mm (ref 0.0–0.7)
Eosinophil %: 2.7 %
HGB: 9.1 g/dL — ABNORMAL LOW (ref 12.0–16.0)
Lymphocyte #: 1 x10 3/mm (ref 1.0–3.6)
Lymphocyte %: 28.6 %
MCH: 32.9 pg (ref 26.0–34.0)
MCHC: 34 g/dL (ref 32.0–36.0)
MCV: 97 fL (ref 80–100)
Monocyte %: 14 %
Neutrophil #: 1.9 x10 3/mm (ref 1.4–6.5)
Neutrophil %: 53.7 %
Platelet: 188 x10 3/mm (ref 150–440)
WBC: 3.6 x10 3/mm (ref 3.6–11.0)

## 2012-11-28 LAB — COMPREHENSIVE METABOLIC PANEL
Alkaline Phosphatase: 73 U/L (ref 50–136)
Anion Gap: 7 (ref 7–16)
Bilirubin,Total: 0.4 mg/dL (ref 0.2–1.0)
Calcium, Total: 9.2 mg/dL (ref 8.5–10.1)
Chloride: 106 mmol/L (ref 98–107)
Co2: 29 mmol/L (ref 21–32)
Creatinine: 1.29 mg/dL (ref 0.60–1.30)
EGFR (African American): 47 — ABNORMAL LOW
EGFR (Non-African Amer.): 40 — ABNORMAL LOW
SGOT(AST): 24 U/L (ref 15–37)
SGPT (ALT): 29 U/L (ref 12–78)
Total Protein: 7.2 g/dL (ref 6.4–8.2)

## 2012-11-29 LAB — KAPPA/LAMBDA FREE LIGHT CHAINS (ARMC)

## 2012-12-05 LAB — CBC CANCER CENTER
Basophil #: 0 x10 3/mm (ref 0.0–0.1)
Basophil %: 0.4 %
Eosinophil #: 0.1 x10 3/mm (ref 0.0–0.7)
Eosinophil %: 1.7 %
HGB: 9 g/dL — ABNORMAL LOW (ref 12.0–16.0)
Lymphocyte #: 1.4 x10 3/mm (ref 1.0–3.6)
MCH: 32.4 pg (ref 26.0–34.0)
MCV: 97 fL (ref 80–100)
Monocyte #: 0.6 x10 3/mm (ref 0.2–0.9)
Monocyte %: 12.3 %
Neutrophil %: 57 %
RBC: 2.78 10*6/uL — ABNORMAL LOW (ref 3.80–5.20)
WBC: 5 x10 3/mm (ref 3.6–11.0)

## 2012-12-07 ENCOUNTER — Ambulatory Visit: Payer: Self-pay | Admitting: Oncology

## 2012-12-12 LAB — CBC CANCER CENTER
Basophil #: 0 x10 3/mm (ref 0.0–0.1)
Eosinophil #: 0.1 x10 3/mm (ref 0.0–0.7)
Eosinophil %: 1.6 %
HGB: 9.4 g/dL — ABNORMAL LOW (ref 12.0–16.0)
Lymphocyte #: 1.2 x10 3/mm (ref 1.0–3.6)
MCHC: 33.8 g/dL (ref 32.0–36.0)
MCV: 98 fL (ref 80–100)
Neutrophil %: 62.2 %
Platelet: 123 x10 3/mm — ABNORMAL LOW (ref 150–440)
RDW: 18.6 % — ABNORMAL HIGH (ref 11.5–14.5)

## 2012-12-12 LAB — COMPREHENSIVE METABOLIC PANEL
Albumin: 3.8 g/dL (ref 3.4–5.0)
Alkaline Phosphatase: 91 U/L (ref 50–136)
Anion Gap: 12 (ref 7–16)
BUN: 21 mg/dL — ABNORMAL HIGH (ref 7–18)
Bilirubin,Total: 0.4 mg/dL (ref 0.2–1.0)
Calcium, Total: 7.8 mg/dL — ABNORMAL LOW (ref 8.5–10.1)
Chloride: 104 mmol/L (ref 98–107)
Co2: 25 mmol/L (ref 21–32)
Creatinine: 1.19 mg/dL (ref 0.60–1.30)
EGFR (African American): 51 — ABNORMAL LOW
EGFR (Non-African Amer.): 44 — ABNORMAL LOW
Glucose: 126 mg/dL — ABNORMAL HIGH (ref 65–99)
Osmolality: 286 (ref 275–301)
SGPT (ALT): 24 U/L (ref 12–78)
Total Protein: 7 g/dL (ref 6.4–8.2)

## 2012-12-19 LAB — CBC CANCER CENTER
Eosinophil #: 0.1 x10 3/mm (ref 0.0–0.7)
Eosinophil %: 1.3 %
HCT: 25.9 % — ABNORMAL LOW (ref 35.0–47.0)
Lymphocyte %: 23.2 %
MCH: 33.4 pg (ref 26.0–34.0)
MCHC: 34.2 g/dL (ref 32.0–36.0)
MCV: 98 fL (ref 80–100)
Monocyte %: 12.2 %
Neutrophil #: 2.7 x10 3/mm (ref 1.4–6.5)
Neutrophil %: 62.9 %
RBC: 2.65 10*6/uL — ABNORMAL LOW (ref 3.80–5.20)
RDW: 18.5 % — ABNORMAL HIGH (ref 11.5–14.5)
WBC: 4.3 x10 3/mm (ref 3.6–11.0)

## 2012-12-26 LAB — CBC CANCER CENTER
Basophil %: 0.5 %
HCT: 27.3 % — ABNORMAL LOW (ref 35.0–47.0)
HGB: 9.2 g/dL — ABNORMAL LOW (ref 12.0–16.0)
Lymphocyte #: 1 x10 3/mm (ref 1.0–3.6)
MCH: 33 pg (ref 26.0–34.0)
MCV: 98 fL (ref 80–100)
Monocyte %: 13.6 %
Neutrophil #: 2.7 x10 3/mm (ref 1.4–6.5)
Platelet: 159 x10 3/mm (ref 150–440)
RDW: 18.5 % — ABNORMAL HIGH (ref 11.5–14.5)

## 2012-12-27 LAB — PROT IMMUNOELECTROPHORES(ARMC)

## 2012-12-27 LAB — KAPPA/LAMBDA FREE LIGHT CHAINS (ARMC)

## 2013-01-02 LAB — COMPREHENSIVE METABOLIC PANEL
Alkaline Phosphatase: 83 U/L (ref 50–136)
BUN: 24 mg/dL — ABNORMAL HIGH (ref 7–18)
Bilirubin,Total: 0.3 mg/dL (ref 0.2–1.0)
Co2: 26 mmol/L (ref 21–32)
Creatinine: 1.31 mg/dL — ABNORMAL HIGH (ref 0.60–1.30)
EGFR (African American): 46 — ABNORMAL LOW
Glucose: 106 mg/dL — ABNORMAL HIGH (ref 65–99)
Osmolality: 284 (ref 275–301)
Total Protein: 7.5 g/dL (ref 6.4–8.2)

## 2013-01-02 LAB — CBC CANCER CENTER
Basophil #: 0 x10 3/mm (ref 0.0–0.1)
Basophil %: 0.9 %
Eosinophil %: 3.3 %
Lymphocyte #: 1.3 x10 3/mm (ref 1.0–3.6)
Lymphocyte %: 28.1 %
MCH: 32.7 pg (ref 26.0–34.0)
MCV: 96 fL (ref 80–100)
Monocyte #: 0.6 x10 3/mm (ref 0.2–0.9)
RBC: 3.11 10*6/uL — ABNORMAL LOW (ref 3.80–5.20)
RDW: 17.6 % — ABNORMAL HIGH (ref 11.5–14.5)
WBC: 4.7 x10 3/mm (ref 3.6–11.0)

## 2013-01-07 ENCOUNTER — Ambulatory Visit: Payer: Self-pay | Admitting: Oncology

## 2013-01-09 LAB — CBC CANCER CENTER
Basophil #: 0 x10 3/mm (ref 0.0–0.1)
Basophil %: 0.5 %
Eosinophil %: 3.1 %
Lymphocyte #: 1.1 x10 3/mm (ref 1.0–3.6)
Lymphocyte %: 26.2 %
MCHC: 32.6 g/dL (ref 32.0–36.0)
Monocyte #: 0.6 x10 3/mm (ref 0.2–0.9)
Monocyte %: 14.2 %
Neutrophil %: 56 %
Platelet: 142 x10 3/mm — ABNORMAL LOW (ref 150–440)

## 2013-01-23 LAB — CBC CANCER CENTER
Basophil #: 0 x10 3/mm (ref 0.0–0.1)
Eosinophil #: 0.1 x10 3/mm (ref 0.0–0.7)
HCT: 29.3 % — ABNORMAL LOW (ref 35.0–47.0)
MCV: 96 fL (ref 80–100)
Monocyte %: 11.3 %
Neutrophil %: 63.4 %
Platelet: 125 x10 3/mm — ABNORMAL LOW (ref 150–440)
RDW: 17.5 % — ABNORMAL HIGH (ref 11.5–14.5)
WBC: 4.6 x10 3/mm (ref 3.6–11.0)

## 2013-01-23 LAB — COMPREHENSIVE METABOLIC PANEL
Albumin: 3.9 g/dL (ref 3.4–5.0)
Anion Gap: 12 (ref 7–16)
BUN: 23 mg/dL — ABNORMAL HIGH (ref 7–18)
Bilirubin,Total: 0.4 mg/dL (ref 0.2–1.0)
Calcium, Total: 9 mg/dL (ref 8.5–10.1)
EGFR (African American): 43 — ABNORMAL LOW
Glucose: 123 mg/dL — ABNORMAL HIGH (ref 65–99)
SGPT (ALT): 23 U/L (ref 12–78)
Total Protein: 6.9 g/dL (ref 6.4–8.2)

## 2013-01-30 LAB — CBC CANCER CENTER
Basophil #: 0 x10 3/mm (ref 0.0–0.1)
Eosinophil %: 2.6 %
HCT: 29 % — ABNORMAL LOW (ref 35.0–47.0)
HGB: 9.7 g/dL — ABNORMAL LOW (ref 12.0–16.0)
Lymphocyte #: 1 x10 3/mm (ref 1.0–3.6)
Lymphocyte %: 19.2 %
MCHC: 33.6 g/dL (ref 32.0–36.0)
MCV: 96 fL (ref 80–100)
Monocyte #: 0.5 x10 3/mm (ref 0.2–0.9)
Monocyte %: 9.5 %
Neutrophil #: 3.4 x10 3/mm (ref 1.4–6.5)
Neutrophil %: 68.4 %
Platelet: 154 x10 3/mm (ref 150–440)
RDW: 17.7 % — ABNORMAL HIGH (ref 11.5–14.5)

## 2013-02-03 LAB — PROT IMMUNOELECTROPHORES(ARMC)

## 2013-02-06 ENCOUNTER — Ambulatory Visit: Payer: Self-pay | Admitting: Oncology

## 2013-03-04 LAB — COMPREHENSIVE METABOLIC PANEL
Alkaline Phosphatase: 78 U/L (ref 50–136)
Anion Gap: 12 (ref 7–16)
Bilirubin,Total: 0.3 mg/dL (ref 0.2–1.0)
Creatinine: 1.28 mg/dL (ref 0.60–1.30)
EGFR (Non-African Amer.): 41 — ABNORMAL LOW
Glucose: 102 mg/dL — ABNORMAL HIGH (ref 65–99)
Osmolality: 287 (ref 275–301)
Potassium: 3.8 mmol/L (ref 3.5–5.1)
SGOT(AST): 25 U/L (ref 15–37)
SGPT (ALT): 28 U/L (ref 12–78)
Sodium: 142 mmol/L (ref 136–145)
Total Protein: 6.9 g/dL (ref 6.4–8.2)

## 2013-03-04 LAB — CBC CANCER CENTER
Basophil #: 0 x10 3/mm (ref 0.0–0.1)
Basophil %: 0.5 %
Eosinophil #: 0.2 x10 3/mm (ref 0.0–0.7)
HGB: 9 g/dL — ABNORMAL LOW (ref 12.0–16.0)
MCH: 31.5 pg (ref 26.0–34.0)
MCHC: 34.2 g/dL (ref 32.0–36.0)
Monocyte #: 0.6 x10 3/mm (ref 0.2–0.9)
Monocyte %: 19.5 %
Neutrophil #: 1.5 x10 3/mm (ref 1.4–6.5)
Neutrophil %: 47.9 %
Platelet: 106 x10 3/mm — ABNORMAL LOW (ref 150–440)
RBC: 2.85 10*6/uL — ABNORMAL LOW (ref 3.80–5.20)
WBC: 3.1 x10 3/mm — ABNORMAL LOW (ref 3.6–11.0)

## 2013-03-06 LAB — PROT IMMUNOELECTROPHORES(ARMC)

## 2013-03-06 LAB — KAPPA/LAMBDA FREE LIGHT CHAINS (ARMC)

## 2013-03-09 ENCOUNTER — Ambulatory Visit: Payer: Self-pay | Admitting: Oncology

## 2013-03-26 LAB — COMPREHENSIVE METABOLIC PANEL
Albumin: 3.5 g/dL (ref 3.4–5.0)
Anion Gap: 6 — ABNORMAL LOW (ref 7–16)
BUN: 23 mg/dL — ABNORMAL HIGH (ref 7–18)
Bilirubin,Total: 0.4 mg/dL (ref 0.2–1.0)
Calcium, Total: 9 mg/dL (ref 8.5–10.1)
Chloride: 110 mmol/L — ABNORMAL HIGH (ref 98–107)
Glucose: 107 mg/dL — ABNORMAL HIGH (ref 65–99)
Osmolality: 285 (ref 275–301)
Potassium: 4 mmol/L (ref 3.5–5.1)
Sodium: 141 mmol/L (ref 136–145)

## 2013-03-26 LAB — CBC CANCER CENTER
Basophil #: 0 x10 3/mm (ref 0.0–0.1)
Eosinophil #: 0.1 x10 3/mm (ref 0.0–0.7)
Eosinophil %: 2.3 %
HCT: 26.4 % — ABNORMAL LOW (ref 35.0–47.0)
HGB: 9.1 g/dL — ABNORMAL LOW (ref 12.0–16.0)
Lymphocyte #: 0.8 x10 3/mm — ABNORMAL LOW (ref 1.0–3.6)
MCH: 31.7 pg (ref 26.0–34.0)
Monocyte #: 0.5 x10 3/mm (ref 0.2–0.9)
Neutrophil #: 4.9 x10 3/mm (ref 1.4–6.5)
RDW: 17.3 % — ABNORMAL HIGH (ref 11.5–14.5)

## 2013-03-27 LAB — PROT IMMUNOELECTROPHORES(ARMC)

## 2013-04-08 ENCOUNTER — Ambulatory Visit: Payer: Self-pay | Admitting: Oncology

## 2013-04-22 LAB — COMPREHENSIVE METABOLIC PANEL
Alkaline Phosphatase: 90 U/L (ref 50–136)
Anion Gap: 12 (ref 7–16)
BUN: 23 mg/dL — ABNORMAL HIGH (ref 7–18)
Bilirubin,Total: 0.3 mg/dL (ref 0.2–1.0)
Co2: 23 mmol/L (ref 21–32)
Creatinine: 1.24 mg/dL (ref 0.60–1.30)
EGFR (African American): 49 — ABNORMAL LOW
EGFR (Non-African Amer.): 42 — ABNORMAL LOW
Glucose: 118 mg/dL — ABNORMAL HIGH (ref 65–99)
Osmolality: 290 (ref 275–301)
Potassium: 4.3 mmol/L (ref 3.5–5.1)
SGPT (ALT): 25 U/L (ref 12–78)

## 2013-04-22 LAB — CBC CANCER CENTER
Basophil #: 0 x10 3/mm (ref 0.0–0.1)
Eosinophil #: 0.2 x10 3/mm (ref 0.0–0.7)
Eosinophil %: 5.1 %
MCHC: 35 g/dL (ref 32.0–36.0)
MCV: 91 fL (ref 80–100)
Monocyte #: 0.3 x10 3/mm (ref 0.2–0.9)
Neutrophil %: 70 %
WBC: 3.5 x10 3/mm — ABNORMAL LOW (ref 3.6–11.0)

## 2013-05-09 ENCOUNTER — Ambulatory Visit: Payer: Self-pay | Admitting: Oncology

## 2013-05-20 LAB — COMPREHENSIVE METABOLIC PANEL
Albumin: 3.4 g/dL (ref 3.4–5.0)
Anion Gap: 11 (ref 7–16)
Bilirubin,Total: 0.3 mg/dL (ref 0.2–1.0)
Calcium, Total: 9.4 mg/dL (ref 8.5–10.1)
Chloride: 103 mmol/L (ref 98–107)
Co2: 26 mmol/L (ref 21–32)
EGFR (African American): 52 — ABNORMAL LOW
Osmolality: 284 (ref 275–301)
SGOT(AST): 17 U/L (ref 15–37)
SGPT (ALT): 21 U/L (ref 12–78)
Sodium: 140 mmol/L (ref 136–145)
Total Protein: 6.4 g/dL (ref 6.4–8.2)

## 2013-05-20 LAB — CBC CANCER CENTER
Basophil #: 0 x10 3/mm (ref 0.0–0.1)
Basophil %: 0.9 %
Eosinophil #: 0.1 x10 3/mm (ref 0.0–0.7)
HCT: 30.7 % — ABNORMAL LOW (ref 35.0–47.0)
Lymphocyte %: 29.2 %
MCHC: 33.9 g/dL (ref 32.0–36.0)
MCV: 89 fL (ref 80–100)
Monocyte %: 8.7 %
RBC: 3.47 10*6/uL — ABNORMAL LOW (ref 3.80–5.20)
RDW: 18.9 % — ABNORMAL HIGH (ref 11.5–14.5)
WBC: 5 x10 3/mm (ref 3.6–11.0)

## 2013-05-20 LAB — IRON AND TIBC
Iron Bind.Cap.(Total): 306 ug/dL (ref 250–450)
Iron Saturation: 18 %
Iron: 54 ug/dL (ref 50–170)
Unbound Iron-Bind.Cap.: 252 ug/dL

## 2013-06-09 ENCOUNTER — Ambulatory Visit: Payer: Self-pay | Admitting: Oncology

## 2013-06-17 LAB — CBC CANCER CENTER
Basophil #: 0.1 x10 3/mm (ref 0.0–0.1)
Basophil %: 1 %
Eosinophil #: 0.2 x10 3/mm (ref 0.0–0.7)
Eosinophil %: 4.3 %
HCT: 32.8 % — ABNORMAL LOW (ref 35.0–47.0)
HGB: 11 g/dL — ABNORMAL LOW (ref 12.0–16.0)
Lymphocyte #: 1.7 x10 3/mm (ref 1.0–3.6)
Lymphocyte %: 32.9 %
MCH: 29.8 pg (ref 26.0–34.0)
MCHC: 33.7 g/dL (ref 32.0–36.0)
MCV: 89 fL (ref 80–100)
Monocyte #: 0.5 x10 3/mm (ref 0.2–0.9)
Monocyte %: 10.3 %
Neutrophil #: 2.7 x10 3/mm (ref 1.4–6.5)
Neutrophil %: 51.5 %
Platelet: 162 x10 3/mm (ref 150–440)
RBC: 3.7 10*6/uL — ABNORMAL LOW (ref 3.80–5.20)
RDW: 18.1 % — ABNORMAL HIGH (ref 11.5–14.5)
WBC: 5.3 x10 3/mm (ref 3.6–11.0)

## 2013-06-17 LAB — BASIC METABOLIC PANEL
Anion Gap: 9 (ref 7–16)
BUN: 26 mg/dL — ABNORMAL HIGH (ref 7–18)
Calcium, Total: 9.8 mg/dL (ref 8.5–10.1)
Chloride: 104 mmol/L (ref 98–107)
Co2: 28 mmol/L (ref 21–32)
Creatinine: 1.29 mg/dL (ref 0.60–1.30)
EGFR (African American): 46 — ABNORMAL LOW
EGFR (Non-African Amer.): 40 — ABNORMAL LOW
Glucose: 102 mg/dL — ABNORMAL HIGH (ref 65–99)
Osmolality: 286 (ref 275–301)
Potassium: 4.1 mmol/L (ref 3.5–5.1)
Sodium: 141 mmol/L (ref 136–145)

## 2013-07-09 ENCOUNTER — Ambulatory Visit: Payer: Self-pay | Admitting: Oncology

## 2013-08-05 LAB — COMPREHENSIVE METABOLIC PANEL
Albumin: 4.2 g/dL (ref 3.4–5.0)
Anion Gap: 12 (ref 7–16)
Bilirubin,Total: 0.4 mg/dL (ref 0.2–1.0)
Chloride: 104 mmol/L (ref 98–107)
Co2: 25 mmol/L (ref 21–32)
EGFR (Non-African Amer.): 30 — ABNORMAL LOW
Osmolality: 287 (ref 275–301)
Potassium: 4.6 mmol/L (ref 3.5–5.1)
SGOT(AST): 26 U/L (ref 15–37)
SGPT (ALT): 23 U/L (ref 12–78)
Total Protein: 7.5 g/dL (ref 6.4–8.2)

## 2013-08-05 LAB — CBC CANCER CENTER
Basophil %: 0.8 %
Eosinophil #: 0.1 x10 3/mm (ref 0.0–0.7)
Eosinophil %: 2.2 %
HCT: 32.1 % — ABNORMAL LOW (ref 35.0–47.0)
HGB: 10.7 g/dL — ABNORMAL LOW (ref 12.0–16.0)
Lymphocyte #: 1.9 x10 3/mm (ref 1.0–3.6)
Lymphocyte %: 32.3 %
Monocyte %: 10.4 %
Neutrophil #: 3.2 x10 3/mm (ref 1.4–6.5)
Neutrophil %: 54.3 %

## 2013-08-09 ENCOUNTER — Ambulatory Visit: Payer: Self-pay | Admitting: Oncology

## 2013-08-11 LAB — KAPPA/LAMBDA FREE LIGHT CHAINS (ARMC)

## 2013-09-02 LAB — CBC CANCER CENTER
Basophil %: 0.9 %
HCT: 30 % — ABNORMAL LOW (ref 35.0–47.0)
HGB: 9.9 g/dL — ABNORMAL LOW (ref 12.0–16.0)
MCH: 30.2 pg (ref 26.0–34.0)
MCHC: 32.9 g/dL (ref 32.0–36.0)
MCV: 92 fL (ref 80–100)
Monocyte %: 10.4 %
RBC: 3.27 10*6/uL — ABNORMAL LOW (ref 3.80–5.20)
RDW: 17.8 % — ABNORMAL HIGH (ref 11.5–14.5)
WBC: 5.4 x10 3/mm (ref 3.6–11.0)

## 2013-09-08 ENCOUNTER — Ambulatory Visit: Payer: Self-pay | Admitting: Oncology

## 2013-09-09 LAB — COMPREHENSIVE METABOLIC PANEL
Alkaline Phosphatase: 94 U/L
Anion Gap: 9 (ref 7–16)
BUN: 27 mg/dL — ABNORMAL HIGH (ref 7–18)
Bilirubin,Total: 0.5 mg/dL (ref 0.2–1.0)
Chloride: 106 mmol/L (ref 98–107)
EGFR (Non-African Amer.): 34 — ABNORMAL LOW
Glucose: 110 mg/dL — ABNORMAL HIGH (ref 65–99)
Osmolality: 285 (ref 275–301)
Potassium: 3.8 mmol/L (ref 3.5–5.1)
SGOT(AST): 23 U/L (ref 15–37)
SGPT (ALT): 29 U/L (ref 12–78)
Total Protein: 7.3 g/dL (ref 6.4–8.2)

## 2013-09-09 LAB — CBC CANCER CENTER
Basophil #: 0 x10 3/mm (ref 0.0–0.1)
Basophil %: 0.6 %
Eosinophil #: 0.1 x10 3/mm (ref 0.0–0.7)
HCT: 29.1 % — ABNORMAL LOW (ref 35.0–47.0)
HGB: 9.6 g/dL — ABNORMAL LOW (ref 12.0–16.0)
Lymphocyte %: 28.4 %
MCH: 30.6 pg (ref 26.0–34.0)
MCV: 93 fL (ref 80–100)
Monocyte #: 0.7 x10 3/mm (ref 0.2–0.9)
Neutrophil #: 4.5 x10 3/mm (ref 1.4–6.5)
Neutrophil %: 60.4 %
Platelet: 132 x10 3/mm — ABNORMAL LOW (ref 150–440)
RBC: 3.15 10*6/uL — ABNORMAL LOW (ref 3.80–5.20)
RDW: 17.9 % — ABNORMAL HIGH (ref 11.5–14.5)

## 2013-09-16 LAB — CBC CANCER CENTER
Basophil #: 0 x10 3/mm (ref 0.0–0.1)
Basophil %: 0.6 %
Eosinophil #: 0.1 x10 3/mm (ref 0.0–0.7)
Eosinophil %: 1.3 %
HCT: 28.4 % — ABNORMAL LOW (ref 35.0–47.0)
HGB: 9.4 g/dL — ABNORMAL LOW (ref 12.0–16.0)
Lymphocyte %: 24.6 %
MCH: 31 pg (ref 26.0–34.0)
MCHC: 33.1 g/dL (ref 32.0–36.0)
MCV: 94 fL (ref 80–100)
Monocyte #: 0.7 x10 3/mm (ref 0.2–0.9)
Neutrophil %: 63.5 %
Platelet: 153 x10 3/mm (ref 150–440)
RBC: 3.03 10*6/uL — ABNORMAL LOW (ref 3.80–5.20)
WBC: 7.4 x10 3/mm (ref 3.6–11.0)

## 2013-10-09 ENCOUNTER — Ambulatory Visit: Payer: Self-pay | Admitting: Oncology

## 2013-10-14 LAB — CBC CANCER CENTER
BASOS ABS: 0 x10 3/mm (ref 0.0–0.1)
Basophil %: 0.5 %
EOS ABS: 0 x10 3/mm (ref 0.0–0.7)
EOS PCT: 0.7 %
HCT: 27.9 % — ABNORMAL LOW (ref 35.0–47.0)
HGB: 9.1 g/dL — AB (ref 12.0–16.0)
Lymphocyte #: 1.6 x10 3/mm (ref 1.0–3.6)
Lymphocyte %: 22.1 %
MCH: 30.7 pg (ref 26.0–34.0)
MCHC: 32.5 g/dL (ref 32.0–36.0)
MCV: 94 fL (ref 80–100)
MONO ABS: 0.9 x10 3/mm (ref 0.2–0.9)
Monocyte %: 13 %
Neutrophil #: 4.5 x10 3/mm (ref 1.4–6.5)
Neutrophil %: 63.7 %
PLATELETS: 134 x10 3/mm — AB (ref 150–440)
RBC: 2.95 10*6/uL — AB (ref 3.80–5.20)
RDW: 18.8 % — ABNORMAL HIGH (ref 11.5–14.5)
WBC: 7 x10 3/mm (ref 3.6–11.0)

## 2013-10-14 LAB — COMPREHENSIVE METABOLIC PANEL
ALBUMIN: 3.8 g/dL (ref 3.4–5.0)
ALT: 23 U/L (ref 12–78)
Alkaline Phosphatase: 62 U/L
Anion Gap: 11 (ref 7–16)
BUN: 17 mg/dL (ref 7–18)
Bilirubin,Total: 0.5 mg/dL (ref 0.2–1.0)
Calcium, Total: 10.2 mg/dL — ABNORMAL HIGH (ref 8.5–10.1)
Chloride: 103 mmol/L (ref 98–107)
Co2: 26 mmol/L (ref 21–32)
Creatinine: 1.29 mg/dL (ref 0.60–1.30)
EGFR (African American): 46 — ABNORMAL LOW
EGFR (Non-African Amer.): 40 — ABNORMAL LOW
Glucose: 92 mg/dL (ref 65–99)
OSMOLALITY: 281 (ref 275–301)
Potassium: 3.9 mmol/L (ref 3.5–5.1)
SGOT(AST): 27 U/L (ref 15–37)
Sodium: 140 mmol/L (ref 136–145)
TOTAL PROTEIN: 7.6 g/dL (ref 6.4–8.2)

## 2013-11-04 LAB — CBC CANCER CENTER
BASOS ABS: 0.1 x10 3/mm (ref 0.0–0.1)
Basophil %: 1.1 %
EOS ABS: 0.1 x10 3/mm (ref 0.0–0.7)
Eosinophil %: 1.7 %
HCT: 24.9 % — ABNORMAL LOW (ref 35.0–47.0)
HGB: 8.1 g/dL — ABNORMAL LOW (ref 12.0–16.0)
Lymphocyte #: 1.6 x10 3/mm (ref 1.0–3.6)
Lymphocyte %: 27.9 %
MCH: 30.1 pg (ref 26.0–34.0)
MCHC: 32.5 g/dL (ref 32.0–36.0)
MCV: 93 fL (ref 80–100)
MONO ABS: 0.6 x10 3/mm (ref 0.2–0.9)
MONOS PCT: 10.8 %
NEUTROS ABS: 3.3 x10 3/mm (ref 1.4–6.5)
Neutrophil %: 58.5 %
PLATELETS: 125 x10 3/mm — AB (ref 150–440)
RBC: 2.69 10*6/uL — AB (ref 3.80–5.20)
RDW: 18.7 % — ABNORMAL HIGH (ref 11.5–14.5)
WBC: 5.7 x10 3/mm (ref 3.6–11.0)

## 2013-11-04 LAB — COMPREHENSIVE METABOLIC PANEL
ALT: 16 U/L (ref 12–78)
AST: 20 U/L (ref 15–37)
Albumin: 3.8 g/dL (ref 3.4–5.0)
Alkaline Phosphatase: 62 U/L
Anion Gap: 10 (ref 7–16)
BUN: 31 mg/dL — AB (ref 7–18)
Bilirubin,Total: 0.2 mg/dL (ref 0.2–1.0)
CREATININE: 2.15 mg/dL — AB (ref 0.60–1.30)
Calcium, Total: 8.4 mg/dL — ABNORMAL LOW (ref 8.5–10.1)
Chloride: 105 mmol/L (ref 98–107)
Co2: 24 mmol/L (ref 21–32)
EGFR (Non-African Amer.): 22 — ABNORMAL LOW
GFR CALC AF AMER: 25 — AB
GLUCOSE: 131 mg/dL — AB (ref 65–99)
Osmolality: 286 (ref 275–301)
POTASSIUM: 3.8 mmol/L (ref 3.5–5.1)
Sodium: 139 mmol/L (ref 136–145)
TOTAL PROTEIN: 7.6 g/dL (ref 6.4–8.2)

## 2013-11-05 LAB — KAPPA/LAMBDA FREE LIGHT CHAINS (ARMC)

## 2013-11-05 LAB — PROT IMMUNOELECTROPHORES(ARMC)

## 2013-11-09 ENCOUNTER — Ambulatory Visit: Payer: Self-pay | Admitting: Oncology

## 2013-11-12 LAB — COMPREHENSIVE METABOLIC PANEL
ALBUMIN: 3.8 g/dL (ref 3.4–5.0)
Alkaline Phosphatase: 75 U/L
Anion Gap: 10 (ref 7–16)
BUN: 36 mg/dL — ABNORMAL HIGH (ref 7–18)
Bilirubin,Total: 0.2 mg/dL (ref 0.2–1.0)
CREATININE: 2.39 mg/dL — AB (ref 0.60–1.30)
Calcium, Total: 8.9 mg/dL (ref 8.5–10.1)
Chloride: 107 mmol/L (ref 98–107)
Co2: 24 mmol/L (ref 21–32)
EGFR (Non-African Amer.): 19 — ABNORMAL LOW
GFR CALC AF AMER: 22 — AB
Glucose: 119 mg/dL — ABNORMAL HIGH (ref 65–99)
OSMOLALITY: 291 (ref 275–301)
Potassium: 5 mmol/L (ref 3.5–5.1)
SGOT(AST): 25 U/L (ref 15–37)
SGPT (ALT): 21 U/L (ref 12–78)
SODIUM: 141 mmol/L (ref 136–145)
TOTAL PROTEIN: 7.9 g/dL (ref 6.4–8.2)

## 2013-11-12 LAB — CBC CANCER CENTER
BASOS ABS: 0.1 x10 3/mm (ref 0.0–0.1)
Basophil %: 1.2 %
Eosinophil #: 0.1 x10 3/mm (ref 0.0–0.7)
Eosinophil %: 1.4 %
HCT: 24.8 % — AB (ref 35.0–47.0)
HGB: 8.1 g/dL — ABNORMAL LOW (ref 12.0–16.0)
LYMPHS PCT: 26.5 %
Lymphocyte #: 1.6 x10 3/mm (ref 1.0–3.6)
MCH: 30.4 pg (ref 26.0–34.0)
MCHC: 32.8 g/dL (ref 32.0–36.0)
MCV: 93 fL (ref 80–100)
MONO ABS: 0.6 x10 3/mm (ref 0.2–0.9)
Monocyte %: 10.6 %
NEUTROS ABS: 3.6 x10 3/mm (ref 1.4–6.5)
NEUTROS PCT: 60.3 %
PLATELETS: 145 x10 3/mm — AB (ref 150–440)
RBC: 2.67 10*6/uL — ABNORMAL LOW (ref 3.80–5.20)
RDW: 18.4 % — AB (ref 11.5–14.5)
WBC: 6 x10 3/mm (ref 3.6–11.0)

## 2013-11-19 LAB — CBC CANCER CENTER
BASOS ABS: 0.1 x10 3/mm (ref 0.0–0.1)
Basophil %: 1.7 %
Eosinophil #: 0.1 x10 3/mm (ref 0.0–0.7)
Eosinophil %: 1.8 %
HCT: 24.2 % — ABNORMAL LOW (ref 35.0–47.0)
HGB: 8 g/dL — ABNORMAL LOW (ref 12.0–16.0)
Lymphocyte #: 2.1 x10 3/mm (ref 1.0–3.6)
Lymphocyte %: 32.6 %
MCH: 30.4 pg (ref 26.0–34.0)
MCHC: 33.1 g/dL (ref 32.0–36.0)
MCV: 92 fL (ref 80–100)
Monocyte #: 0.8 x10 3/mm (ref 0.2–0.9)
Monocyte %: 12.4 %
Neutrophil #: 3.3 x10 3/mm (ref 1.4–6.5)
Neutrophil %: 51.5 %
PLATELETS: 110 x10 3/mm — AB (ref 150–440)
RBC: 2.64 10*6/uL — AB (ref 3.80–5.20)
RDW: 18.5 % — ABNORMAL HIGH (ref 11.5–14.5)
WBC: 6.4 x10 3/mm (ref 3.6–11.0)

## 2013-11-19 LAB — BASIC METABOLIC PANEL
Anion Gap: 10 (ref 7–16)
BUN: 33 mg/dL — ABNORMAL HIGH (ref 7–18)
CO2: 23 mmol/L (ref 21–32)
Calcium, Total: 7.8 mg/dL — ABNORMAL LOW (ref 8.5–10.1)
Chloride: 103 mmol/L (ref 98–107)
Creatinine: 2.17 mg/dL — ABNORMAL HIGH (ref 0.60–1.30)
GFR CALC AF AMER: 25 — AB
GFR CALC NON AF AMER: 21 — AB
Glucose: 110 mg/dL — ABNORMAL HIGH (ref 65–99)
OSMOLALITY: 280 (ref 275–301)
Potassium: 4.1 mmol/L (ref 3.5–5.1)
Sodium: 136 mmol/L (ref 136–145)

## 2013-11-26 LAB — BASIC METABOLIC PANEL
Anion Gap: 11 (ref 7–16)
BUN: 33 mg/dL — AB (ref 7–18)
CO2: 25 mmol/L (ref 21–32)
Calcium, Total: 8.6 mg/dL (ref 8.5–10.1)
Chloride: 103 mmol/L (ref 98–107)
Creatinine: 2.13 mg/dL — ABNORMAL HIGH (ref 0.60–1.30)
EGFR (African American): 25 — ABNORMAL LOW
EGFR (Non-African Amer.): 22 — ABNORMAL LOW
Glucose: 101 mg/dL — ABNORMAL HIGH (ref 65–99)
Osmolality: 285 (ref 275–301)
Potassium: 4.6 mmol/L (ref 3.5–5.1)
Sodium: 139 mmol/L (ref 136–145)

## 2013-11-26 LAB — CBC CANCER CENTER
BASOS ABS: 0.1 x10 3/mm (ref 0.0–0.1)
Basophil %: 2.1 %
EOS PCT: 1.5 %
Eosinophil #: 0.1 x10 3/mm (ref 0.0–0.7)
HCT: 24.3 % — ABNORMAL LOW (ref 35.0–47.0)
HGB: 7.9 g/dL — AB (ref 12.0–16.0)
LYMPHS ABS: 1.9 x10 3/mm (ref 1.0–3.6)
LYMPHS PCT: 28.1 %
MCH: 29.9 pg (ref 26.0–34.0)
MCHC: 32.5 g/dL (ref 32.0–36.0)
MCV: 92 fL (ref 80–100)
Monocyte #: 0.8 x10 3/mm (ref 0.2–0.9)
Monocyte %: 12.2 %
NEUTROS PCT: 56.1 %
Neutrophil #: 3.8 x10 3/mm (ref 1.4–6.5)
Platelet: 126 x10 3/mm — ABNORMAL LOW (ref 150–440)
RBC: 2.65 10*6/uL — AB (ref 3.80–5.20)
RDW: 18.8 % — AB (ref 11.5–14.5)
WBC: 6.8 x10 3/mm (ref 3.6–11.0)

## 2013-12-07 ENCOUNTER — Ambulatory Visit: Payer: Self-pay | Admitting: Oncology

## 2013-12-10 LAB — CBC CANCER CENTER
Basophil #: 0.1 x10 3/mm (ref 0.0–0.1)
Basophil %: 1.3 %
EOS PCT: 2.3 %
Eosinophil #: 0.1 x10 3/mm (ref 0.0–0.7)
HCT: 22.2 % — ABNORMAL LOW (ref 35.0–47.0)
HGB: 7.2 g/dL — ABNORMAL LOW (ref 12.0–16.0)
LYMPHS ABS: 1.7 x10 3/mm (ref 1.0–3.6)
Lymphocyte %: 31.5 %
MCH: 29.7 pg (ref 26.0–34.0)
MCHC: 32.5 g/dL (ref 32.0–36.0)
MCV: 92 fL (ref 80–100)
Monocyte #: 0.8 x10 3/mm (ref 0.2–0.9)
Monocyte %: 14.5 %
NEUTROS PCT: 50.4 %
Neutrophil #: 2.8 x10 3/mm (ref 1.4–6.5)
Platelet: 153 x10 3/mm (ref 150–440)
RBC: 2.42 10*6/uL — AB (ref 3.80–5.20)
RDW: 20 % — AB (ref 11.5–14.5)
WBC: 5.5 x10 3/mm (ref 3.6–11.0)

## 2013-12-10 LAB — COMPREHENSIVE METABOLIC PANEL
ALT: 28 U/L (ref 12–78)
ANION GAP: 7 (ref 7–16)
Albumin: 3.7 g/dL (ref 3.4–5.0)
Alkaline Phosphatase: 89 U/L
BUN: 39 mg/dL — AB (ref 7–18)
Bilirubin,Total: 0.3 mg/dL (ref 0.2–1.0)
CALCIUM: 9.4 mg/dL (ref 8.5–10.1)
CHLORIDE: 108 mmol/L — AB (ref 98–107)
CO2: 24 mmol/L (ref 21–32)
CREATININE: 2.84 mg/dL — AB (ref 0.60–1.30)
EGFR (African American): 18 — ABNORMAL LOW
EGFR (Non-African Amer.): 15 — ABNORMAL LOW
Glucose: 106 mg/dL — ABNORMAL HIGH (ref 65–99)
Osmolality: 287 (ref 275–301)
POTASSIUM: 4.6 mmol/L (ref 3.5–5.1)
SGOT(AST): 27 U/L (ref 15–37)
Sodium: 139 mmol/L (ref 136–145)
Total Protein: 8.2 g/dL (ref 6.4–8.2)

## 2013-12-10 LAB — IRON AND TIBC
Iron Bind.Cap.(Total): 308 ug/dL (ref 250–450)
Iron Saturation: 24 %
Iron: 74 ug/dL (ref 50–170)
Unbound Iron-Bind.Cap.: 234 ug/dL

## 2013-12-10 LAB — FERRITIN: Ferritin (ARMC): 1978 ng/mL — ABNORMAL HIGH (ref 8–388)

## 2013-12-12 LAB — PROT IMMUNOELECTROPHORES(ARMC)

## 2013-12-17 LAB — COMPREHENSIVE METABOLIC PANEL
ALBUMIN: 3.6 g/dL (ref 3.4–5.0)
ALK PHOS: 99 U/L
Anion Gap: 10 (ref 7–16)
BUN: 38 mg/dL — AB (ref 7–18)
Bilirubin,Total: 0.3 mg/dL (ref 0.2–1.0)
CREATININE: 2.32 mg/dL — AB (ref 0.60–1.30)
Calcium, Total: 8.8 mg/dL (ref 8.5–10.1)
Chloride: 104 mmol/L (ref 98–107)
Co2: 24 mmol/L (ref 21–32)
EGFR (African American): 23 — ABNORMAL LOW
GFR CALC NON AF AMER: 20 — AB
Glucose: 113 mg/dL — ABNORMAL HIGH (ref 65–99)
OSMOLALITY: 286 (ref 275–301)
Potassium: 4.4 mmol/L (ref 3.5–5.1)
SGOT(AST): 27 U/L (ref 15–37)
SGPT (ALT): 25 U/L (ref 12–78)
Sodium: 138 mmol/L (ref 136–145)
TOTAL PROTEIN: 7.9 g/dL (ref 6.4–8.2)

## 2013-12-17 LAB — CBC CANCER CENTER
BASOS ABS: 0.1 x10 3/mm (ref 0.0–0.1)
Basophil %: 1.4 %
Eosinophil #: 0.1 x10 3/mm (ref 0.0–0.7)
Eosinophil %: 1.7 %
HCT: 26 % — ABNORMAL LOW (ref 35.0–47.0)
HGB: 8.4 g/dL — AB (ref 12.0–16.0)
LYMPHS ABS: 1.4 x10 3/mm (ref 1.0–3.6)
Lymphocyte %: 29.7 %
MCH: 29.6 pg (ref 26.0–34.0)
MCHC: 32.5 g/dL (ref 32.0–36.0)
MCV: 91 fL (ref 80–100)
MONO ABS: 0.6 x10 3/mm (ref 0.2–0.9)
Monocyte %: 13 %
NEUTROS ABS: 2.6 x10 3/mm (ref 1.4–6.5)
Neutrophil %: 54.2 %
Platelet: 108 x10 3/mm — ABNORMAL LOW (ref 150–440)
RBC: 2.85 10*6/uL — AB (ref 3.80–5.20)
RDW: 18.6 % — AB (ref 11.5–14.5)
WBC: 4.8 x10 3/mm (ref 3.6–11.0)

## 2013-12-24 LAB — CBC CANCER CENTER
Basophil #: 0 x10 3/mm (ref 0.0–0.1)
Basophil %: 0.7 %
EOS PCT: 1.7 %
Eosinophil #: 0.1 x10 3/mm (ref 0.0–0.7)
HCT: 24.1 % — ABNORMAL LOW (ref 35.0–47.0)
HGB: 8.1 g/dL — AB (ref 12.0–16.0)
Lymphocyte #: 1.1 x10 3/mm (ref 1.0–3.6)
Lymphocyte %: 25.4 %
MCH: 30.6 pg (ref 26.0–34.0)
MCHC: 33.7 g/dL (ref 32.0–36.0)
MCV: 91 fL (ref 80–100)
MONOS PCT: 13.6 %
Monocyte #: 0.6 x10 3/mm (ref 0.2–0.9)
Neutrophil #: 2.5 x10 3/mm (ref 1.4–6.5)
Neutrophil %: 58.6 %
Platelet: 98 x10 3/mm — ABNORMAL LOW (ref 150–440)
RBC: 2.65 10*6/uL — ABNORMAL LOW (ref 3.80–5.20)
RDW: 18.7 % — AB (ref 11.5–14.5)
WBC: 4.2 x10 3/mm (ref 3.6–11.0)

## 2013-12-24 LAB — COMPREHENSIVE METABOLIC PANEL
ALBUMIN: 3.6 g/dL (ref 3.4–5.0)
ALK PHOS: 114 U/L
Anion Gap: 12 (ref 7–16)
BILIRUBIN TOTAL: 0.4 mg/dL (ref 0.2–1.0)
BUN: 32 mg/dL — AB (ref 7–18)
CHLORIDE: 104 mmol/L (ref 98–107)
Calcium, Total: 8.8 mg/dL (ref 8.5–10.1)
Co2: 22 mmol/L (ref 21–32)
Creatinine: 2.6 mg/dL — ABNORMAL HIGH (ref 0.60–1.30)
EGFR (African American): 20 — ABNORMAL LOW
EGFR (Non-African Amer.): 17 — ABNORMAL LOW
GLUCOSE: 118 mg/dL — AB (ref 65–99)
Osmolality: 284 (ref 275–301)
Potassium: 4.2 mmol/L (ref 3.5–5.1)
SGOT(AST): 28 U/L (ref 15–37)
SGPT (ALT): 32 U/L (ref 12–78)
SODIUM: 138 mmol/L (ref 136–145)
Total Protein: 7.8 g/dL (ref 6.4–8.2)

## 2013-12-26 LAB — PROT IMMUNOELECTROPHORES(ARMC)

## 2013-12-26 LAB — KAPPA/LAMBDA FREE LIGHT CHAINS (ARMC)

## 2013-12-31 LAB — CBC CANCER CENTER
Basophil #: 0 x10 3/mm (ref 0.0–0.1)
Basophil %: 1 %
EOS PCT: 2.4 %
Eosinophil #: 0.1 x10 3/mm (ref 0.0–0.7)
HCT: 23.4 % — AB (ref 35.0–47.0)
HGB: 7.7 g/dL — ABNORMAL LOW (ref 12.0–16.0)
Lymphocyte #: 1 x10 3/mm (ref 1.0–3.6)
Lymphocyte %: 32.5 %
MCH: 30.1 pg (ref 26.0–34.0)
MCHC: 32.9 g/dL (ref 32.0–36.0)
MCV: 92 fL (ref 80–100)
MONOS PCT: 16.1 %
Monocyte #: 0.5 x10 3/mm (ref 0.2–0.9)
NEUTROS PCT: 48 %
Neutrophil #: 1.5 x10 3/mm (ref 1.4–6.5)
PLATELETS: 124 x10 3/mm — AB (ref 150–440)
RBC: 2.55 10*6/uL — ABNORMAL LOW (ref 3.80–5.20)
RDW: 19.6 % — ABNORMAL HIGH (ref 11.5–14.5)
WBC: 3.1 x10 3/mm — ABNORMAL LOW (ref 3.6–11.0)

## 2013-12-31 LAB — CREATININE, SERUM
Creatinine: 2 mg/dL — ABNORMAL HIGH (ref 0.60–1.30)
EGFR (Non-African Amer.): 23 — ABNORMAL LOW
GFR CALC AF AMER: 27 — AB

## 2014-01-07 ENCOUNTER — Ambulatory Visit: Payer: Self-pay | Admitting: Oncology

## 2014-01-07 LAB — COMPREHENSIVE METABOLIC PANEL
ALBUMIN: 3.7 g/dL (ref 3.4–5.0)
ALK PHOS: 136 U/L — AB
ALT: 44 U/L (ref 12–78)
ANION GAP: 12 (ref 7–16)
BUN: 27 mg/dL — ABNORMAL HIGH (ref 7–18)
Bilirubin,Total: 0.4 mg/dL (ref 0.2–1.0)
CALCIUM: 9.2 mg/dL (ref 8.5–10.1)
Chloride: 107 mmol/L (ref 98–107)
Co2: 23 mmol/L (ref 21–32)
Creatinine: 2.19 mg/dL — ABNORMAL HIGH (ref 0.60–1.30)
EGFR (African American): 24 — ABNORMAL LOW
EGFR (Non-African Amer.): 21 — ABNORMAL LOW
Glucose: 112 mg/dL — ABNORMAL HIGH (ref 65–99)
Osmolality: 289 (ref 275–301)
Potassium: 4.2 mmol/L (ref 3.5–5.1)
SGOT(AST): 39 U/L — ABNORMAL HIGH (ref 15–37)
SODIUM: 142 mmol/L (ref 136–145)
Total Protein: 7.7 g/dL (ref 6.4–8.2)

## 2014-01-07 LAB — FERRITIN: Ferritin (ARMC): 1699 ng/mL — ABNORMAL HIGH (ref 8–388)

## 2014-01-07 LAB — IRON AND TIBC
IRON SATURATION: 25 %
Iron Bind.Cap.(Total): 288 ug/dL (ref 250–450)
Iron: 73 ug/dL (ref 50–170)
Unbound Iron-Bind.Cap.: 215 ug/dL

## 2014-01-07 LAB — CBC CANCER CENTER
BASOS ABS: 0 x10 3/mm (ref 0.0–0.1)
BASOS PCT: 0.4 %
EOS PCT: 2.8 %
Eosinophil #: 0.1 x10 3/mm (ref 0.0–0.7)
HCT: 22.9 % — ABNORMAL LOW (ref 35.0–47.0)
HGB: 7.3 g/dL — ABNORMAL LOW (ref 12.0–16.0)
LYMPHS ABS: 1.4 x10 3/mm (ref 1.0–3.6)
LYMPHS PCT: 36.1 %
MCH: 29.4 pg (ref 26.0–34.0)
MCHC: 31.8 g/dL — AB (ref 32.0–36.0)
MCV: 92 fL (ref 80–100)
MONOS PCT: 13.1 %
Monocyte #: 0.5 x10 3/mm (ref 0.2–0.9)
Neutrophil #: 1.8 x10 3/mm (ref 1.4–6.5)
Neutrophil %: 47.6 %
PLATELETS: 148 x10 3/mm — AB (ref 150–440)
RBC: 2.48 10*6/uL — AB (ref 3.80–5.20)
RDW: 20.4 % — AB (ref 11.5–14.5)
WBC: 3.9 x10 3/mm (ref 3.6–11.0)

## 2014-01-09 LAB — PROT IMMUNOELECTROPHORES(ARMC)

## 2014-01-09 LAB — KAPPA/LAMBDA FREE LIGHT CHAINS (ARMC)

## 2014-01-14 LAB — CBC CANCER CENTER
BASOS ABS: 0 x10 3/mm (ref 0.0–0.1)
Basophil %: 0.8 %
EOS PCT: 2.1 %
Eosinophil #: 0.1 x10 3/mm (ref 0.0–0.7)
HCT: 26.9 % — ABNORMAL LOW (ref 35.0–47.0)
HGB: 8.7 g/dL — ABNORMAL LOW (ref 12.0–16.0)
LYMPHS PCT: 35.2 %
Lymphocyte #: 0.9 x10 3/mm — ABNORMAL LOW (ref 1.0–3.6)
MCH: 30 pg (ref 26.0–34.0)
MCHC: 32.5 g/dL (ref 32.0–36.0)
MCV: 92 fL (ref 80–100)
Monocyte #: 0.4 x10 3/mm (ref 0.2–0.9)
Monocyte %: 14.3 %
Neutrophil #: 1.2 x10 3/mm — ABNORMAL LOW (ref 1.4–6.5)
Neutrophil %: 47.6 %
PLATELETS: 97 x10 3/mm — AB (ref 150–440)
RBC: 2.92 10*6/uL — ABNORMAL LOW (ref 3.80–5.20)
RDW: 19.1 % — AB (ref 11.5–14.5)
WBC: 2.6 x10 3/mm — ABNORMAL LOW (ref 3.6–11.0)

## 2014-01-14 LAB — COMPREHENSIVE METABOLIC PANEL
Albumin: 3.7 g/dL (ref 3.4–5.0)
Alkaline Phosphatase: 107 U/L
Anion Gap: 11 (ref 7–16)
BILIRUBIN TOTAL: 0.5 mg/dL (ref 0.2–1.0)
BUN: 25 mg/dL — AB (ref 7–18)
CALCIUM: 9.1 mg/dL (ref 8.5–10.1)
Chloride: 105 mmol/L (ref 98–107)
Co2: 25 mmol/L (ref 21–32)
Creatinine: 2.11 mg/dL — ABNORMAL HIGH (ref 0.60–1.30)
EGFR (African American): 26 — ABNORMAL LOW
EGFR (Non-African Amer.): 22 — ABNORMAL LOW
GLUCOSE: 110 mg/dL — AB (ref 65–99)
OSMOLALITY: 286 (ref 275–301)
POTASSIUM: 4.5 mmol/L (ref 3.5–5.1)
SGOT(AST): 33 U/L (ref 15–37)
SGPT (ALT): 32 U/L (ref 12–78)
SODIUM: 141 mmol/L (ref 136–145)
Total Protein: 7.7 g/dL (ref 6.4–8.2)

## 2014-01-21 LAB — CBC CANCER CENTER
BASOS ABS: 0 x10 3/mm (ref 0.0–0.1)
Basophil %: 1.2 %
EOS ABS: 0.1 x10 3/mm (ref 0.0–0.7)
EOS PCT: 3.4 %
HCT: 26.3 % — ABNORMAL LOW (ref 35.0–47.0)
HGB: 8.6 g/dL — AB (ref 12.0–16.0)
LYMPHS ABS: 0.7 x10 3/mm — AB (ref 1.0–3.6)
LYMPHS PCT: 37.6 %
MCH: 30.1 pg (ref 26.0–34.0)
MCHC: 32.6 g/dL (ref 32.0–36.0)
MCV: 92 fL (ref 80–100)
MONOS PCT: 17.3 %
Monocyte #: 0.3 x10 3/mm (ref 0.2–0.9)
NEUTROS ABS: 0.7 x10 3/mm — AB (ref 1.4–6.5)
Neutrophil %: 40.5 %
PLATELETS: 90 x10 3/mm — AB (ref 150–440)
RBC: 2.85 10*6/uL — ABNORMAL LOW (ref 3.80–5.20)
RDW: 20.1 % — AB (ref 11.5–14.5)
WBC: 1.8 x10 3/mm — CL (ref 3.6–11.0)

## 2014-01-21 LAB — COMPREHENSIVE METABOLIC PANEL
ALBUMIN: 3.8 g/dL (ref 3.4–5.0)
ALK PHOS: 99 U/L
ALT: 21 U/L (ref 12–78)
ANION GAP: 13 (ref 7–16)
AST: 24 U/L (ref 15–37)
BILIRUBIN TOTAL: 0.5 mg/dL (ref 0.2–1.0)
BUN: 28 mg/dL — AB (ref 7–18)
CALCIUM: 8.9 mg/dL (ref 8.5–10.1)
CO2: 23 mmol/L (ref 21–32)
CREATININE: 1.92 mg/dL — AB (ref 0.60–1.30)
Chloride: 107 mmol/L (ref 98–107)
EGFR (Non-African Amer.): 25 — ABNORMAL LOW
GFR CALC AF AMER: 29 — AB
GLUCOSE: 117 mg/dL — AB (ref 65–99)
Osmolality: 291 (ref 275–301)
Potassium: 3.9 mmol/L (ref 3.5–5.1)
SODIUM: 143 mmol/L (ref 136–145)
Total Protein: 7.7 g/dL (ref 6.4–8.2)

## 2014-01-28 LAB — CBC CANCER CENTER
BASOS ABS: 0 x10 3/mm (ref 0.0–0.1)
Basophil %: 1.2 %
EOS PCT: 3.3 %
Eosinophil #: 0.1 x10 3/mm (ref 0.0–0.7)
HCT: 25 % — ABNORMAL LOW (ref 35.0–47.0)
HGB: 8.1 g/dL — AB (ref 12.0–16.0)
Lymphocyte #: 0.7 x10 3/mm — ABNORMAL LOW (ref 1.0–3.6)
Lymphocyte %: 27.7 %
MCH: 30.5 pg (ref 26.0–34.0)
MCHC: 32.5 g/dL (ref 32.0–36.0)
MCV: 94 fL (ref 80–100)
Monocyte #: 0.4 x10 3/mm (ref 0.2–0.9)
Monocyte %: 14.1 %
NEUTROS PCT: 53.7 %
Neutrophil #: 1.4 x10 3/mm (ref 1.4–6.5)
Platelet: 147 x10 3/mm — ABNORMAL LOW (ref 150–440)
RBC: 2.67 10*6/uL — ABNORMAL LOW (ref 3.80–5.20)
RDW: 20.6 % — ABNORMAL HIGH (ref 11.5–14.5)
WBC: 2.6 x10 3/mm — ABNORMAL LOW (ref 3.6–11.0)

## 2014-01-28 LAB — COMPREHENSIVE METABOLIC PANEL
ALT: 22 U/L (ref 12–78)
Albumin: 3.8 g/dL (ref 3.4–5.0)
Alkaline Phosphatase: 93 U/L
Anion Gap: 10 (ref 7–16)
BUN: 31 mg/dL — ABNORMAL HIGH (ref 7–18)
Bilirubin,Total: 0.4 mg/dL (ref 0.2–1.0)
CHLORIDE: 107 mmol/L (ref 98–107)
Calcium, Total: 9.4 mg/dL (ref 8.5–10.1)
Co2: 24 mmol/L (ref 21–32)
Creatinine: 2.13 mg/dL — ABNORMAL HIGH (ref 0.60–1.30)
EGFR (African American): 25 — ABNORMAL LOW
EGFR (Non-African Amer.): 22 — ABNORMAL LOW
Glucose: 107 mg/dL — ABNORMAL HIGH (ref 65–99)
Osmolality: 288 (ref 275–301)
Potassium: 4.3 mmol/L (ref 3.5–5.1)
SGOT(AST): 23 U/L (ref 15–37)
SODIUM: 141 mmol/L (ref 136–145)
TOTAL PROTEIN: 7.6 g/dL (ref 6.4–8.2)

## 2014-02-04 LAB — COMPREHENSIVE METABOLIC PANEL
ALBUMIN: 3.8 g/dL (ref 3.4–5.0)
ALT: 22 U/L (ref 12–78)
Alkaline Phosphatase: 90 U/L
Anion Gap: 12 (ref 7–16)
BUN: 30 mg/dL — ABNORMAL HIGH (ref 7–18)
Bilirubin,Total: 0.5 mg/dL (ref 0.2–1.0)
CALCIUM: 8.8 mg/dL (ref 8.5–10.1)
Chloride: 106 mmol/L (ref 98–107)
Co2: 25 mmol/L (ref 21–32)
Creatinine: 1.98 mg/dL — ABNORMAL HIGH (ref 0.60–1.30)
EGFR (African American): 28 — ABNORMAL LOW
GFR CALC NON AF AMER: 24 — AB
Glucose: 101 mg/dL — ABNORMAL HIGH (ref 65–99)
Osmolality: 291 (ref 275–301)
Potassium: 4.4 mmol/L (ref 3.5–5.1)
SGOT(AST): 24 U/L (ref 15–37)
SODIUM: 143 mmol/L (ref 136–145)
Total Protein: 7.6 g/dL (ref 6.4–8.2)

## 2014-02-04 LAB — CBC CANCER CENTER
BASOS ABS: 0 x10 3/mm (ref 0.0–0.1)
Basophil %: 0.4 %
EOS ABS: 0.1 x10 3/mm (ref 0.0–0.7)
Eosinophil %: 2 %
HCT: 23.5 % — ABNORMAL LOW (ref 35.0–47.0)
HGB: 8 g/dL — ABNORMAL LOW (ref 12.0–16.0)
Lymphocyte #: 0.9 x10 3/mm — ABNORMAL LOW (ref 1.0–3.6)
Lymphocyte %: 23.5 %
MCH: 31.8 pg (ref 26.0–34.0)
MCHC: 33.9 g/dL (ref 32.0–36.0)
MCV: 94 fL (ref 80–100)
MONO ABS: 0.5 x10 3/mm (ref 0.2–0.9)
MONOS PCT: 14 %
NEUTROS ABS: 2.2 x10 3/mm (ref 1.4–6.5)
Neutrophil %: 60.1 %
PLATELETS: 93 x10 3/mm — AB (ref 150–440)
RBC: 2.51 10*6/uL — ABNORMAL LOW (ref 3.80–5.20)
RDW: 21.4 % — AB (ref 11.5–14.5)
WBC: 3.7 x10 3/mm (ref 3.6–11.0)

## 2014-02-05 LAB — KAPPA/LAMBDA FREE LIGHT CHAINS (ARMC)

## 2014-02-06 ENCOUNTER — Ambulatory Visit: Payer: Self-pay | Admitting: Oncology

## 2014-02-11 LAB — CBC CANCER CENTER
BASOS ABS: 0 x10 3/mm (ref 0.0–0.1)
Basophil %: 0.8 %
Eosinophil #: 0.1 x10 3/mm (ref 0.0–0.7)
Eosinophil %: 1.9 %
HCT: 21.9 % — ABNORMAL LOW (ref 35.0–47.0)
HGB: 7.3 g/dL — AB (ref 12.0–16.0)
LYMPHS ABS: 0.9 x10 3/mm — AB (ref 1.0–3.6)
Lymphocyte %: 30.7 %
MCH: 32 pg (ref 26.0–34.0)
MCHC: 33.6 g/dL (ref 32.0–36.0)
MCV: 95 fL (ref 80–100)
Monocyte #: 0.4 x10 3/mm (ref 0.2–0.9)
Monocyte %: 13.7 %
NEUTROS ABS: 1.5 x10 3/mm (ref 1.4–6.5)
Neutrophil %: 52.9 %
Platelet: 76 x10 3/mm — ABNORMAL LOW (ref 150–440)
RBC: 2.3 10*6/uL — ABNORMAL LOW (ref 3.80–5.20)
RDW: 22.7 % — ABNORMAL HIGH (ref 11.5–14.5)
WBC: 2.9 x10 3/mm — ABNORMAL LOW (ref 3.6–11.0)

## 2014-02-11 LAB — BASIC METABOLIC PANEL
Anion Gap: 10 (ref 7–16)
BUN: 35 mg/dL — AB (ref 7–18)
CHLORIDE: 107 mmol/L (ref 98–107)
Calcium, Total: 8.5 mg/dL (ref 8.5–10.1)
Co2: 24 mmol/L (ref 21–32)
Creatinine: 1.93 mg/dL — ABNORMAL HIGH (ref 0.60–1.30)
EGFR (African American): 28 — ABNORMAL LOW
GFR CALC NON AF AMER: 25 — AB
GLUCOSE: 123 mg/dL — AB (ref 65–99)
Osmolality: 291 (ref 275–301)
Potassium: 4.1 mmol/L (ref 3.5–5.1)
SODIUM: 141 mmol/L (ref 136–145)

## 2014-02-18 LAB — COMPREHENSIVE METABOLIC PANEL
ALBUMIN: 3.7 g/dL (ref 3.4–5.0)
ALK PHOS: 193 U/L — AB
AST: 85 U/L — AB (ref 15–37)
Anion Gap: 9 (ref 7–16)
BUN: 36 mg/dL — ABNORMAL HIGH (ref 7–18)
Bilirubin,Total: 0.4 mg/dL (ref 0.2–1.0)
CALCIUM: 9.3 mg/dL (ref 8.5–10.1)
CHLORIDE: 108 mmol/L — AB (ref 98–107)
CO2: 23 mmol/L (ref 21–32)
Creatinine: 2.04 mg/dL — ABNORMAL HIGH (ref 0.60–1.30)
GFR CALC AF AMER: 27 — AB
GFR CALC NON AF AMER: 23 — AB
GLUCOSE: 116 mg/dL — AB (ref 65–99)
Osmolality: 289 (ref 275–301)
Potassium: 4.6 mmol/L (ref 3.5–5.1)
SGPT (ALT): 137 U/L — ABNORMAL HIGH (ref 12–78)
Sodium: 140 mmol/L (ref 136–145)
TOTAL PROTEIN: 7.4 g/dL (ref 6.4–8.2)

## 2014-02-18 LAB — CBC CANCER CENTER
Basophil #: 0 x10 3/mm (ref 0.0–0.1)
Basophil %: 0.5 %
EOS PCT: 0.8 %
Eosinophil #: 0 x10 3/mm (ref 0.0–0.7)
HCT: 19.9 % — AB (ref 35.0–47.0)
HGB: 6.7 g/dL — ABNORMAL LOW (ref 12.0–16.0)
LYMPHS ABS: 0.7 x10 3/mm — AB (ref 1.0–3.6)
LYMPHS PCT: 20.3 %
MCH: 32.5 pg (ref 26.0–34.0)
MCHC: 33.6 g/dL (ref 32.0–36.0)
MCV: 97 fL (ref 80–100)
MONOS PCT: 10.5 %
Monocyte #: 0.4 x10 3/mm (ref 0.2–0.9)
NEUTROS ABS: 2.4 x10 3/mm (ref 1.4–6.5)
Neutrophil %: 67.9 %
PLATELETS: 131 x10 3/mm — AB (ref 150–440)
RBC: 2.06 10*6/uL — AB (ref 3.80–5.20)
RDW: 23.1 % — ABNORMAL HIGH (ref 11.5–14.5)
WBC: 3.6 x10 3/mm (ref 3.6–11.0)

## 2014-02-20 LAB — PROT IMMUNOELECTROPHORES(ARMC)

## 2014-02-20 LAB — KAPPA/LAMBDA FREE LIGHT CHAINS (ARMC)

## 2014-02-23 LAB — PROT IMMUNOELECT,UR-24HR

## 2014-02-25 LAB — COMPREHENSIVE METABOLIC PANEL
ALK PHOS: 131 U/L — AB
ANION GAP: 11 (ref 7–16)
Albumin: 4 g/dL (ref 3.4–5.0)
BUN: 36 mg/dL — ABNORMAL HIGH (ref 7–18)
Bilirubin,Total: 0.4 mg/dL (ref 0.2–1.0)
CHLORIDE: 106 mmol/L (ref 98–107)
CO2: 23 mmol/L (ref 21–32)
CREATININE: 2.52 mg/dL — AB (ref 0.60–1.30)
Calcium, Total: 9.3 mg/dL (ref 8.5–10.1)
EGFR (African American): 21 — ABNORMAL LOW
EGFR (Non-African Amer.): 18 — ABNORMAL LOW
GLUCOSE: 113 mg/dL — AB (ref 65–99)
Osmolality: 289 (ref 275–301)
POTASSIUM: 4.3 mmol/L (ref 3.5–5.1)
SGOT(AST): 23 U/L (ref 15–37)
SGPT (ALT): 36 U/L (ref 12–78)
Sodium: 140 mmol/L (ref 136–145)
TOTAL PROTEIN: 7.8 g/dL (ref 6.4–8.2)

## 2014-02-25 LAB — CBC CANCER CENTER
BASOS PCT: 1.1 %
Basophil #: 0 x10 3/mm (ref 0.0–0.1)
EOS PCT: 0.6 %
Eosinophil #: 0 x10 3/mm (ref 0.0–0.7)
HCT: 26.2 % — AB (ref 35.0–47.0)
HGB: 8.9 g/dL — AB (ref 12.0–16.0)
Lymphocyte #: 0.8 x10 3/mm — ABNORMAL LOW (ref 1.0–3.6)
Lymphocyte %: 30.2 %
MCH: 30 pg (ref 26.0–34.0)
MCHC: 33.8 g/dL (ref 32.0–36.0)
MCV: 89 fL (ref 80–100)
MONO ABS: 0.3 x10 3/mm (ref 0.2–0.9)
MONOS PCT: 13 %
Neutrophil #: 1.4 x10 3/mm (ref 1.4–6.5)
Neutrophil %: 55.1 %
PLATELETS: 118 x10 3/mm — AB (ref 150–440)
RBC: 2.95 10*6/uL — AB (ref 3.80–5.20)
RDW: 25.4 % — ABNORMAL HIGH (ref 11.5–14.5)
WBC: 2.5 x10 3/mm — AB (ref 3.6–11.0)

## 2014-03-04 LAB — CBC CANCER CENTER
Basophil #: 0 x10 3/mm (ref 0.0–0.1)
Basophil %: 0.5 %
Eosinophil #: 0 x10 3/mm (ref 0.0–0.7)
Eosinophil %: 0.3 %
HCT: 25.7 % — ABNORMAL LOW (ref 35.0–47.0)
HGB: 8.8 g/dL — ABNORMAL LOW (ref 12.0–16.0)
LYMPHS ABS: 1.3 x10 3/mm (ref 1.0–3.6)
Lymphocyte %: 20.3 %
MCH: 31.7 pg (ref 26.0–34.0)
MCHC: 34.2 g/dL (ref 32.0–36.0)
MCV: 93 fL (ref 80–100)
MONO ABS: 0.7 x10 3/mm (ref 0.2–0.9)
Monocyte %: 10.3 %
NEUTROS ABS: 4.4 x10 3/mm (ref 1.4–6.5)
NEUTROS PCT: 68.6 %
Platelet: 132 x10 3/mm — ABNORMAL LOW (ref 150–440)
RBC: 2.77 10*6/uL — AB (ref 3.80–5.20)
RDW: 26.7 % — ABNORMAL HIGH (ref 11.5–14.5)
WBC: 6.3 x10 3/mm (ref 3.6–11.0)

## 2014-03-04 LAB — COMPREHENSIVE METABOLIC PANEL
ALBUMIN: 3.8 g/dL (ref 3.4–5.0)
Alkaline Phosphatase: 94 U/L
Anion Gap: 9 (ref 7–16)
BUN: 39 mg/dL — AB (ref 7–18)
Bilirubin,Total: 0.7 mg/dL (ref 0.2–1.0)
CHLORIDE: 110 mmol/L — AB (ref 98–107)
CO2: 20 mmol/L — AB (ref 21–32)
Calcium, Total: 8.6 mg/dL (ref 8.5–10.1)
Creatinine: 1.65 mg/dL — ABNORMAL HIGH (ref 0.60–1.30)
GFR CALC AF AMER: 34 — AB
GFR CALC NON AF AMER: 30 — AB
GLUCOSE: 111 mg/dL — AB (ref 65–99)
Osmolality: 288 (ref 275–301)
Potassium: 3.6 mmol/L (ref 3.5–5.1)
SGOT(AST): 24 U/L (ref 15–37)
SGPT (ALT): 23 U/L (ref 12–78)
Sodium: 139 mmol/L (ref 136–145)
TOTAL PROTEIN: 7.1 g/dL (ref 6.4–8.2)

## 2014-03-04 LAB — IRON AND TIBC
IRON SATURATION: 38 %
Iron Bind.Cap.(Total): 254 ug/dL (ref 250–450)
Iron: 96 ug/dL (ref 50–170)
UNBOUND IRON-BIND. CAP.: 158 ug/dL

## 2014-03-09 ENCOUNTER — Ambulatory Visit: Payer: Self-pay | Admitting: Oncology

## 2014-03-11 LAB — CBC CANCER CENTER
Basophil #: 0 x10 3/mm (ref 0.0–0.1)
Basophil %: 0.2 %
EOS ABS: 0 x10 3/mm (ref 0.0–0.7)
Eosinophil %: 0.1 %
HCT: 28 % — ABNORMAL LOW (ref 35.0–47.0)
HGB: 9.1 g/dL — ABNORMAL LOW (ref 12.0–16.0)
LYMPHS ABS: 0.8 x10 3/mm — AB (ref 1.0–3.6)
Lymphocyte %: 8.1 %
MCH: 31 pg (ref 26.0–34.0)
MCHC: 32.4 g/dL (ref 32.0–36.0)
MCV: 96 fL (ref 80–100)
MONOS PCT: 6.9 %
Monocyte #: 0.7 x10 3/mm (ref 0.2–0.9)
NEUTROS ABS: 8 x10 3/mm — AB (ref 1.4–6.5)
Neutrophil %: 84.7 %
Platelet: 106 x10 3/mm — ABNORMAL LOW (ref 150–440)
RBC: 2.92 10*6/uL — ABNORMAL LOW (ref 3.80–5.20)
RDW: 28.6 % — ABNORMAL HIGH (ref 11.5–14.5)
WBC: 9.5 x10 3/mm (ref 3.6–11.0)

## 2014-03-11 LAB — COMPREHENSIVE METABOLIC PANEL
ALK PHOS: 116 U/L
ANION GAP: 14 (ref 7–16)
Albumin: 4.2 g/dL (ref 3.4–5.0)
BUN: 41 mg/dL — AB (ref 7–18)
Bilirubin,Total: 0.5 mg/dL (ref 0.2–1.0)
CALCIUM: 9.2 mg/dL (ref 8.5–10.1)
CHLORIDE: 105 mmol/L (ref 98–107)
CREATININE: 1.53 mg/dL — AB (ref 0.60–1.30)
Co2: 21 mmol/L (ref 21–32)
GFR CALC AF AMER: 37 — AB
GFR CALC NON AF AMER: 32 — AB
GLUCOSE: 114 mg/dL — AB (ref 65–99)
Osmolality: 290 (ref 275–301)
Potassium: 4.6 mmol/L (ref 3.5–5.1)
SGOT(AST): 22 U/L (ref 15–37)
SGPT (ALT): 27 U/L (ref 12–78)
SODIUM: 140 mmol/L (ref 136–145)
TOTAL PROTEIN: 7.7 g/dL (ref 6.4–8.2)

## 2014-03-16 LAB — PROT IMMUNOELECTROPHORES(ARMC)

## 2014-03-16 LAB — KAPPA/LAMBDA FREE LIGHT CHAINS (ARMC)

## 2014-03-18 LAB — CBC CANCER CENTER
Basophil #: 0 x10 3/mm (ref 0.0–0.1)
Basophil %: 0.8 %
EOS PCT: 1 %
Eosinophil #: 0 x10 3/mm (ref 0.0–0.7)
HCT: 25.3 % — AB (ref 35.0–47.0)
HGB: 8.4 g/dL — AB (ref 12.0–16.0)
LYMPHS PCT: 15.2 %
Lymphocyte #: 0.6 x10 3/mm — ABNORMAL LOW (ref 1.0–3.6)
MCH: 31.8 pg (ref 26.0–34.0)
MCHC: 33.3 g/dL (ref 32.0–36.0)
MCV: 96 fL (ref 80–100)
MONO ABS: 0.4 x10 3/mm (ref 0.2–0.9)
Monocyte %: 10.2 %
NEUTROS ABS: 2.9 x10 3/mm (ref 1.4–6.5)
Neutrophil %: 72.8 %
PLATELETS: 109 x10 3/mm — AB (ref 150–440)
RBC: 2.65 10*6/uL — ABNORMAL LOW (ref 3.80–5.20)
RDW: 27.7 % — AB (ref 11.5–14.5)
WBC: 4 x10 3/mm (ref 3.6–11.0)

## 2014-03-18 LAB — COMPREHENSIVE METABOLIC PANEL
ALBUMIN: 3.5 g/dL (ref 3.4–5.0)
ALT: 23 U/L (ref 12–78)
Alkaline Phosphatase: 106 U/L
Anion Gap: 7 (ref 7–16)
BILIRUBIN TOTAL: 0.5 mg/dL (ref 0.2–1.0)
BUN: 31 mg/dL — ABNORMAL HIGH (ref 7–18)
CALCIUM: 8.7 mg/dL (ref 8.5–10.1)
CREATININE: 1.44 mg/dL — AB (ref 0.60–1.30)
Chloride: 107 mmol/L (ref 98–107)
Co2: 27 mmol/L (ref 21–32)
EGFR (Non-African Amer.): 35 — ABNORMAL LOW
GFR CALC AF AMER: 40 — AB
Glucose: 112 mg/dL — ABNORMAL HIGH (ref 65–99)
Osmolality: 289 (ref 275–301)
POTASSIUM: 4.8 mmol/L (ref 3.5–5.1)
SGOT(AST): 18 U/L (ref 15–37)
SODIUM: 141 mmol/L (ref 136–145)
Total Protein: 7 g/dL (ref 6.4–8.2)

## 2014-04-01 LAB — CBC CANCER CENTER
BASOS ABS: 0 x10 3/mm (ref 0.0–0.1)
BASOS PCT: 0.6 %
EOS PCT: 1.5 %
Eosinophil #: 0.1 x10 3/mm (ref 0.0–0.7)
HCT: 21.5 % — AB (ref 35.0–47.0)
HGB: 7.2 g/dL — AB (ref 12.0–16.0)
LYMPHS ABS: 1.1 x10 3/mm (ref 1.0–3.6)
LYMPHS PCT: 21.2 %
MCH: 31.3 pg (ref 26.0–34.0)
MCHC: 33.7 g/dL (ref 32.0–36.0)
MCV: 93 fL (ref 80–100)
Monocyte #: 0.5 x10 3/mm (ref 0.2–0.9)
Monocyte %: 10.3 %
Neutrophil #: 3.5 x10 3/mm (ref 1.4–6.5)
Neutrophil %: 66.4 %
Platelet: 165 x10 3/mm (ref 150–440)
RBC: 2.31 10*6/uL — AB (ref 3.80–5.20)
RDW: 27.1 % — ABNORMAL HIGH (ref 11.5–14.5)
WBC: 5.3 x10 3/mm (ref 3.6–11.0)

## 2014-04-01 LAB — COMPREHENSIVE METABOLIC PANEL
ALBUMIN: 3.5 g/dL (ref 3.4–5.0)
AST: 24 U/L (ref 15–37)
Alkaline Phosphatase: 148 U/L — ABNORMAL HIGH
Anion Gap: 11 (ref 7–16)
BUN: 40 mg/dL — ABNORMAL HIGH (ref 7–18)
Bilirubin,Total: 0.4 mg/dL (ref 0.2–1.0)
CO2: 20 mmol/L — AB (ref 21–32)
Calcium, Total: 9.4 mg/dL (ref 8.5–10.1)
Chloride: 107 mmol/L (ref 98–107)
Creatinine: 3.43 mg/dL — ABNORMAL HIGH (ref 0.60–1.30)
EGFR (African American): 14 — ABNORMAL LOW
EGFR (Non-African Amer.): 12 — ABNORMAL LOW
GLUCOSE: 103 mg/dL — AB (ref 65–99)
Osmolality: 286 (ref 275–301)
Potassium: 4.1 mmol/L (ref 3.5–5.1)
SGPT (ALT): 22 U/L (ref 12–78)
Sodium: 138 mmol/L (ref 136–145)
Total Protein: 7.6 g/dL (ref 6.4–8.2)

## 2014-04-08 ENCOUNTER — Ambulatory Visit: Payer: Self-pay | Admitting: Oncology

## 2014-04-08 LAB — CBC CANCER CENTER
BASOS PCT: 0.7 %
Basophil #: 0 x10 3/mm (ref 0.0–0.1)
Eosinophil #: 0.1 x10 3/mm (ref 0.0–0.7)
Eosinophil %: 1.6 %
HCT: 21 % — ABNORMAL LOW (ref 35.0–47.0)
HGB: 6.9 g/dL — AB (ref 12.0–16.0)
LYMPHS PCT: 21.4 %
Lymphocyte #: 1.5 x10 3/mm (ref 1.0–3.6)
MCH: 31.7 pg (ref 26.0–34.0)
MCHC: 33.1 g/dL (ref 32.0–36.0)
MCV: 96 fL (ref 80–100)
MONO ABS: 0.7 x10 3/mm (ref 0.2–0.9)
MONOS PCT: 10.2 %
NEUTROS ABS: 4.7 x10 3/mm (ref 1.4–6.5)
Neutrophil %: 66.1 %
PLATELETS: 118 x10 3/mm — AB (ref 150–440)
RBC: 2.18 10*6/uL — AB (ref 3.80–5.20)
RDW: 27.7 % — ABNORMAL HIGH (ref 11.5–14.5)
WBC: 7.1 x10 3/mm (ref 3.6–11.0)

## 2014-04-08 LAB — COMPREHENSIVE METABOLIC PANEL
ANION GAP: 11 (ref 7–16)
Albumin: 3.4 g/dL (ref 3.4–5.0)
Alkaline Phosphatase: 137 U/L — ABNORMAL HIGH
BUN: 34 mg/dL — AB (ref 7–18)
Bilirubin,Total: 0.4 mg/dL (ref 0.2–1.0)
CREATININE: 2.52 mg/dL — AB (ref 0.60–1.30)
Calcium, Total: 8.7 mg/dL (ref 8.5–10.1)
Chloride: 107 mmol/L (ref 98–107)
Co2: 24 mmol/L (ref 21–32)
GFR CALC AF AMER: 20 — AB
GFR CALC NON AF AMER: 18 — AB
GLUCOSE: 103 mg/dL — AB (ref 65–99)
Osmolality: 291 (ref 275–301)
POTASSIUM: 4.1 mmol/L (ref 3.5–5.1)
SGOT(AST): 20 U/L (ref 15–37)
SGPT (ALT): 24 U/L (ref 12–78)
Sodium: 142 mmol/L (ref 136–145)
Total Protein: 7.6 g/dL (ref 6.4–8.2)

## 2014-04-13 LAB — PROT IMMUNOELECTROPHORES(ARMC)

## 2014-04-13 LAB — KAPPA/LAMBDA FREE LIGHT CHAINS (ARMC)

## 2014-04-15 LAB — CBC CANCER CENTER
BASOS PCT: 1.2 %
Basophil #: 0.1 x10 3/mm (ref 0.0–0.1)
Eosinophil #: 0.1 x10 3/mm (ref 0.0–0.7)
Eosinophil %: 2.2 %
HCT: 31.5 % — ABNORMAL LOW (ref 35.0–47.0)
HGB: 10.2 g/dL — ABNORMAL LOW (ref 12.0–16.0)
LYMPHS ABS: 1.5 x10 3/mm (ref 1.0–3.6)
Lymphocyte %: 24.1 %
MCH: 29.9 pg (ref 26.0–34.0)
MCHC: 32.6 g/dL (ref 32.0–36.0)
MCV: 92 fL (ref 80–100)
MONO ABS: 0.8 x10 3/mm (ref 0.2–0.9)
Monocyte %: 12.4 %
Neutrophil #: 3.7 x10 3/mm (ref 1.4–6.5)
Neutrophil %: 60.1 %
Platelet: 70 x10 3/mm — ABNORMAL LOW (ref 150–440)
RBC: 3.43 10*6/uL — ABNORMAL LOW (ref 3.80–5.20)
RDW: 24.1 % — AB (ref 11.5–14.5)
WBC: 6.2 x10 3/mm (ref 3.6–11.0)

## 2014-04-15 LAB — COMPREHENSIVE METABOLIC PANEL
ALT: 44 U/L (ref 12–78)
Albumin: 3.7 g/dL (ref 3.4–5.0)
Alkaline Phosphatase: 174 U/L — ABNORMAL HIGH
Anion Gap: 9 (ref 7–16)
BILIRUBIN TOTAL: 0.4 mg/dL (ref 0.2–1.0)
BUN: 35 mg/dL — ABNORMAL HIGH (ref 7–18)
CALCIUM: 9.7 mg/dL (ref 8.5–10.1)
CHLORIDE: 106 mmol/L (ref 98–107)
CREATININE: 2.56 mg/dL — AB (ref 0.60–1.30)
Co2: 26 mmol/L (ref 21–32)
EGFR (Non-African Amer.): 17 — ABNORMAL LOW
GFR CALC AF AMER: 20 — AB
Glucose: 96 mg/dL (ref 65–99)
Osmolality: 289 (ref 275–301)
Potassium: 4.8 mmol/L (ref 3.5–5.1)
SGOT(AST): 36 U/L (ref 15–37)
SODIUM: 141 mmol/L (ref 136–145)
Total Protein: 8 g/dL (ref 6.4–8.2)

## 2014-04-22 LAB — COMPREHENSIVE METABOLIC PANEL
ALBUMIN: 3.7 g/dL (ref 3.4–5.0)
Alkaline Phosphatase: 143 U/L — ABNORMAL HIGH
Anion Gap: 14 (ref 7–16)
BUN: 31 mg/dL — AB (ref 7–18)
Bilirubin,Total: 0.5 mg/dL (ref 0.2–1.0)
CALCIUM: 9.5 mg/dL (ref 8.5–10.1)
CREATININE: 2.47 mg/dL — AB (ref 0.60–1.30)
Chloride: 104 mmol/L (ref 98–107)
Co2: 22 mmol/L (ref 21–32)
GFR CALC AF AMER: 21 — AB
GFR CALC NON AF AMER: 18 — AB
Glucose: 97 mg/dL (ref 65–99)
Osmolality: 286 (ref 275–301)
POTASSIUM: 4.5 mmol/L (ref 3.5–5.1)
SGOT(AST): 32 U/L (ref 15–37)
SGPT (ALT): 41 U/L (ref 12–78)
Sodium: 140 mmol/L (ref 136–145)
Total Protein: 8 g/dL (ref 6.4–8.2)

## 2014-04-22 LAB — CBC CANCER CENTER
Basophil #: 0.1 x10 3/mm (ref 0.0–0.1)
Basophil %: 1.2 %
EOS ABS: 0.1 x10 3/mm (ref 0.0–0.7)
Eosinophil %: 1.8 %
HCT: 30 % — AB (ref 35.0–47.0)
HGB: 9.9 g/dL — AB (ref 12.0–16.0)
LYMPHS PCT: 29.2 %
Lymphocyte #: 1.4 x10 3/mm (ref 1.0–3.6)
MCH: 29.9 pg (ref 26.0–34.0)
MCHC: 33.1 g/dL (ref 32.0–36.0)
MCV: 90 fL (ref 80–100)
Monocyte #: 0.5 x10 3/mm (ref 0.2–0.9)
Monocyte %: 11.8 %
NEUTROS ABS: 2.6 x10 3/mm (ref 1.4–6.5)
NEUTROS PCT: 56 %
Platelet: 74 x10 3/mm — ABNORMAL LOW (ref 150–440)
RBC: 3.32 10*6/uL — ABNORMAL LOW (ref 3.80–5.20)
RDW: 23.3 % — ABNORMAL HIGH (ref 11.5–14.5)
WBC: 4.6 x10 3/mm (ref 3.6–11.0)

## 2014-05-06 LAB — COMPREHENSIVE METABOLIC PANEL
ALBUMIN: 3.5 g/dL (ref 3.4–5.0)
ALK PHOS: 209 U/L — AB
ALT: 56 U/L
Anion Gap: 10 (ref 7–16)
BILIRUBIN TOTAL: 0.4 mg/dL (ref 0.2–1.0)
BUN: 36 mg/dL — ABNORMAL HIGH (ref 7–18)
Calcium, Total: 9.4 mg/dL (ref 8.5–10.1)
Chloride: 106 mmol/L (ref 98–107)
Co2: 23 mmol/L (ref 21–32)
Creatinine: 2.93 mg/dL — ABNORMAL HIGH (ref 0.60–1.30)
GFR CALC AF AMER: 17 — AB
GFR CALC NON AF AMER: 15 — AB
GLUCOSE: 103 mg/dL — AB (ref 65–99)
OSMOLALITY: 286 (ref 275–301)
Potassium: 4.4 mmol/L (ref 3.5–5.1)
SGOT(AST): 41 U/L — ABNORMAL HIGH (ref 15–37)
Sodium: 139 mmol/L (ref 136–145)
Total Protein: 7.8 g/dL (ref 6.4–8.2)

## 2014-05-06 LAB — CBC CANCER CENTER
Basophil #: 0 x10 3/mm (ref 0.0–0.1)
Basophil %: 1 %
EOS PCT: 2 %
Eosinophil #: 0.1 x10 3/mm (ref 0.0–0.7)
HCT: 22.9 % — ABNORMAL LOW (ref 35.0–47.0)
HGB: 7.5 g/dL — ABNORMAL LOW (ref 12.0–16.0)
LYMPHS ABS: 0.9 x10 3/mm — AB (ref 1.0–3.6)
LYMPHS PCT: 27.5 %
MCH: 30 pg (ref 26.0–34.0)
MCHC: 32.5 g/dL (ref 32.0–36.0)
MCV: 92 fL (ref 80–100)
MONO ABS: 0.4 x10 3/mm (ref 0.2–0.9)
Monocyte %: 12.4 %
Neutrophil #: 1.9 x10 3/mm (ref 1.4–6.5)
Neutrophil %: 57.1 %
Platelet: 88 x10 3/mm — ABNORMAL LOW (ref 150–440)
RBC: 2.49 10*6/uL — ABNORMAL LOW (ref 3.80–5.20)
RDW: 24 % — ABNORMAL HIGH (ref 11.5–14.5)
WBC: 3.4 x10 3/mm — AB (ref 3.6–11.0)

## 2014-05-08 LAB — KAPPA/LAMBDA FREE LIGHT CHAINS (ARMC)

## 2014-05-08 LAB — PROT IMMUNOELECTROPHORES(ARMC)

## 2014-05-09 ENCOUNTER — Ambulatory Visit: Payer: Self-pay | Admitting: Oncology

## 2014-05-13 LAB — CBC CANCER CENTER
BASOS ABS: 0 x10 3/mm (ref 0.0–0.1)
Basophil %: 0.8 %
Eosinophil #: 0.1 x10 3/mm (ref 0.0–0.7)
Eosinophil %: 1.3 %
HCT: 27.1 % — ABNORMAL LOW (ref 35.0–47.0)
HGB: 8.8 g/dL — AB (ref 12.0–16.0)
LYMPHS PCT: 31 %
Lymphocyte #: 1.6 x10 3/mm (ref 1.0–3.6)
MCH: 29.9 pg (ref 26.0–34.0)
MCHC: 32.3 g/dL (ref 32.0–36.0)
MCV: 93 fL (ref 80–100)
MONO ABS: 0.7 x10 3/mm (ref 0.2–0.9)
MONOS PCT: 14.4 %
NEUTROS ABS: 2.7 x10 3/mm (ref 1.4–6.5)
Neutrophil %: 52.5 %
PLATELETS: 100 x10 3/mm — AB (ref 150–440)
RBC: 2.93 10*6/uL — AB (ref 3.80–5.20)
RDW: 21.3 % — AB (ref 11.5–14.5)
WBC: 5.1 x10 3/mm (ref 3.6–11.0)

## 2014-05-13 LAB — COMPREHENSIVE METABOLIC PANEL
ANION GAP: 8 (ref 7–16)
Albumin: 3.7 g/dL (ref 3.4–5.0)
Alkaline Phosphatase: 182 U/L — ABNORMAL HIGH
BILIRUBIN TOTAL: 0.3 mg/dL (ref 0.2–1.0)
BUN: 31 mg/dL — AB (ref 7–18)
CALCIUM: 10.1 mg/dL (ref 8.5–10.1)
CO2: 25 mmol/L (ref 21–32)
Chloride: 103 mmol/L (ref 98–107)
Creatinine: 2.9 mg/dL — ABNORMAL HIGH (ref 0.60–1.30)
EGFR (Non-African Amer.): 15 — ABNORMAL LOW
GFR CALC AF AMER: 17 — AB
Glucose: 99 mg/dL (ref 65–99)
OSMOLALITY: 279 (ref 275–301)
Potassium: 4.7 mmol/L (ref 3.5–5.1)
SGOT(AST): 44 U/L — ABNORMAL HIGH (ref 15–37)
SGPT (ALT): 55 U/L
Sodium: 136 mmol/L (ref 136–145)
TOTAL PROTEIN: 8.3 g/dL — AB (ref 6.4–8.2)

## 2014-05-20 LAB — CANCER CENTER HEMOGLOBIN: HGB: 8.4 g/dL — ABNORMAL LOW (ref 12.0–16.0)

## 2014-05-25 LAB — CANCER CENTER HEMOGLOBIN: HGB: 8.1 g/dL — ABNORMAL LOW (ref 12.0–16.0)

## 2014-06-01 LAB — COMPREHENSIVE METABOLIC PANEL
ALBUMIN: 3.4 g/dL (ref 3.4–5.0)
ANION GAP: 7 (ref 7–16)
AST: 32 U/L (ref 15–37)
Alkaline Phosphatase: 142 U/L — ABNORMAL HIGH
BUN: 54 mg/dL — ABNORMAL HIGH (ref 7–18)
Bilirubin,Total: 0.4 mg/dL (ref 0.2–1.0)
CHLORIDE: 109 mmol/L — AB (ref 98–107)
CO2: 21 mmol/L (ref 21–32)
Calcium, Total: 8.5 mg/dL (ref 8.5–10.1)
Creatinine: 3.83 mg/dL — ABNORMAL HIGH (ref 0.60–1.30)
EGFR (African American): 12 — ABNORMAL LOW
EGFR (Non-African Amer.): 11 — ABNORMAL LOW
Glucose: 106 mg/dL — ABNORMAL HIGH (ref 65–99)
Osmolality: 289 (ref 275–301)
Potassium: 5.3 mmol/L — ABNORMAL HIGH (ref 3.5–5.1)
SGPT (ALT): 31 U/L
Sodium: 137 mmol/L (ref 136–145)
Total Protein: 7.9 g/dL (ref 6.4–8.2)

## 2014-06-01 LAB — CBC CANCER CENTER
BASOS PCT: 0.8 %
Basophil #: 0 x10 3/mm (ref 0.0–0.1)
EOS PCT: 1.2 %
Eosinophil #: 0.1 x10 3/mm (ref 0.0–0.7)
HCT: 23.1 % — AB (ref 35.0–47.0)
HGB: 7.5 g/dL — ABNORMAL LOW (ref 12.0–16.0)
Lymphocyte #: 1.6 x10 3/mm (ref 1.0–3.6)
Lymphocyte %: 32 %
MCH: 30.7 pg (ref 26.0–34.0)
MCHC: 32.7 g/dL (ref 32.0–36.0)
MCV: 94 fL (ref 80–100)
Monocyte #: 0.6 x10 3/mm (ref 0.2–0.9)
Monocyte %: 11.5 %
NEUTROS PCT: 54.5 %
Neutrophil #: 2.7 x10 3/mm (ref 1.4–6.5)
Platelet: 54 x10 3/mm — ABNORMAL LOW (ref 150–440)
RBC: 2.45 10*6/uL — AB (ref 3.80–5.20)
RDW: 23.2 % — ABNORMAL HIGH (ref 11.5–14.5)
WBC: 4.9 x10 3/mm (ref 3.6–11.0)

## 2014-06-08 LAB — COMPREHENSIVE METABOLIC PANEL
ALT: 18 U/L
Albumin: 3.5 g/dL (ref 3.4–5.0)
Alkaline Phosphatase: 145 U/L — ABNORMAL HIGH
Anion Gap: 10 (ref 7–16)
BUN: 36 mg/dL — ABNORMAL HIGH (ref 7–18)
Bilirubin,Total: 0.4 mg/dL (ref 0.2–1.0)
CALCIUM: 8.7 mg/dL (ref 8.5–10.1)
CREATININE: 3.27 mg/dL — AB (ref 0.60–1.30)
Chloride: 105 mmol/L (ref 98–107)
Co2: 22 mmol/L (ref 21–32)
EGFR (African American): 15 — ABNORMAL LOW
EGFR (Non-African Amer.): 13 — ABNORMAL LOW
Glucose: 102 mg/dL — ABNORMAL HIGH (ref 65–99)
OSMOLALITY: 282 (ref 275–301)
Potassium: 5 mmol/L (ref 3.5–5.1)
SGOT(AST): 18 U/L (ref 15–37)
SODIUM: 137 mmol/L (ref 136–145)
Total Protein: 8 g/dL (ref 6.4–8.2)

## 2014-06-08 LAB — CBC CANCER CENTER
BASOS PCT: 1.3 %
Basophil #: 0 x10 3/mm (ref 0.0–0.1)
Eosinophil #: 0.1 x10 3/mm (ref 0.0–0.7)
Eosinophil %: 1.7 %
HCT: 24.9 % — ABNORMAL LOW (ref 35.0–47.0)
HGB: 8.1 g/dL — ABNORMAL LOW (ref 12.0–16.0)
LYMPHS PCT: 31.7 %
Lymphocyte #: 1 x10 3/mm (ref 1.0–3.6)
MCH: 30.4 pg (ref 26.0–34.0)
MCHC: 32.6 g/dL (ref 32.0–36.0)
MCV: 93 fL (ref 80–100)
Monocyte #: 0.5 x10 3/mm (ref 0.2–0.9)
Monocyte %: 16.9 %
Neutrophil #: 1.6 x10 3/mm (ref 1.4–6.5)
Neutrophil %: 48.4 %
Platelet: 80 x10 3/mm — ABNORMAL LOW (ref 150–440)
RBC: 2.67 10*6/uL — ABNORMAL LOW (ref 3.80–5.20)
RDW: 21.3 % — ABNORMAL HIGH (ref 11.5–14.5)
WBC: 3.2 x10 3/mm — ABNORMAL LOW (ref 3.6–11.0)

## 2014-06-09 ENCOUNTER — Ambulatory Visit: Payer: Self-pay | Admitting: Oncology

## 2014-06-17 LAB — COMPREHENSIVE METABOLIC PANEL
ALBUMIN: 3.4 g/dL (ref 3.4–5.0)
ANION GAP: 11 (ref 7–16)
Alkaline Phosphatase: 145 U/L — ABNORMAL HIGH
BUN: 36 mg/dL — ABNORMAL HIGH (ref 7–18)
Bilirubin,Total: 0.5 mg/dL (ref 0.2–1.0)
CO2: 22 mmol/L (ref 21–32)
CREATININE: 3.07 mg/dL — AB (ref 0.60–1.30)
Calcium, Total: 9.7 mg/dL (ref 8.5–10.1)
Chloride: 104 mmol/L (ref 98–107)
EGFR (African American): 16 — ABNORMAL LOW
EGFR (Non-African Amer.): 14 — ABNORMAL LOW
Glucose: 113 mg/dL — ABNORMAL HIGH (ref 65–99)
Osmolality: 283 (ref 275–301)
POTASSIUM: 4.9 mmol/L (ref 3.5–5.1)
SGOT(AST): 32 U/L (ref 15–37)
SGPT (ALT): 34 U/L
Sodium: 137 mmol/L (ref 136–145)
Total Protein: 8.2 g/dL (ref 6.4–8.2)

## 2014-06-17 LAB — CBC CANCER CENTER
BASOS PCT: 0.9 %
Basophil #: 0 x10 3/mm (ref 0.0–0.1)
Eosinophil #: 0.1 x10 3/mm (ref 0.0–0.7)
Eosinophil %: 1.7 %
HCT: 21.5 % — AB (ref 35.0–47.0)
HGB: 7 g/dL — AB (ref 12.0–16.0)
LYMPHS ABS: 0.6 x10 3/mm — AB (ref 1.0–3.6)
Lymphocyte %: 19.7 %
MCH: 30.7 pg (ref 26.0–34.0)
MCHC: 32.7 g/dL (ref 32.0–36.0)
MCV: 94 fL (ref 80–100)
MONO ABS: 0.9 x10 3/mm (ref 0.2–0.9)
Monocyte %: 29.4 %
NEUTROS ABS: 1.5 x10 3/mm (ref 1.4–6.5)
NEUTROS PCT: 48.3 %
Platelet: 66 x10 3/mm — ABNORMAL LOW (ref 150–440)
RBC: 2.29 10*6/uL — ABNORMAL LOW (ref 3.80–5.20)
RDW: 21.8 % — ABNORMAL HIGH (ref 11.5–14.5)
WBC: 3.1 x10 3/mm — ABNORMAL LOW (ref 3.6–11.0)

## 2014-06-24 LAB — CBC CANCER CENTER
Basophil #: 0 x10 3/mm (ref 0.0–0.1)
Basophil %: 0.7 %
Eosinophil #: 0.1 x10 3/mm (ref 0.0–0.7)
Eosinophil %: 3 %
HCT: 28.4 % — AB (ref 35.0–47.0)
HGB: 9.3 g/dL — ABNORMAL LOW (ref 12.0–16.0)
LYMPHS PCT: 13.5 %
Lymphocyte #: 0.6 x10 3/mm — ABNORMAL LOW (ref 1.0–3.6)
MCH: 30.2 pg (ref 26.0–34.0)
MCHC: 32.8 g/dL (ref 32.0–36.0)
MCV: 92 fL (ref 80–100)
MONO ABS: 1.5 x10 3/mm — AB (ref 0.2–0.9)
Monocyte %: 32.2 %
NEUTROS ABS: 2.3 x10 3/mm (ref 1.4–6.5)
NEUTROS PCT: 50.6 %
PLATELETS: 36 x10 3/mm — AB (ref 150–440)
RBC: 3.08 10*6/uL — AB (ref 3.80–5.20)
RDW: 18.3 % — ABNORMAL HIGH (ref 11.5–14.5)
WBC: 4.6 x10 3/mm (ref 3.6–11.0)

## 2014-07-02 ENCOUNTER — Inpatient Hospital Stay: Payer: Self-pay | Admitting: Oncology

## 2014-07-02 LAB — BASIC METABOLIC PANEL
Anion Gap: 10 (ref 7–16)
BUN: 66 mg/dL — AB (ref 7–18)
CREATININE: 5.53 mg/dL — AB (ref 0.60–1.30)
Calcium, Total: 11.3 mg/dL — ABNORMAL HIGH (ref 8.5–10.1)
Chloride: 105 mmol/L (ref 98–107)
Co2: 20 mmol/L — ABNORMAL LOW (ref 21–32)
EGFR (African American): 10 — ABNORMAL LOW
EGFR (Non-African Amer.): 8 — ABNORMAL LOW
GLUCOSE: 101 mg/dL — AB (ref 65–99)
OSMOLALITY: 289 (ref 275–301)
Potassium: 4.6 mmol/L (ref 3.5–5.1)
Sodium: 135 mmol/L — ABNORMAL LOW (ref 136–145)

## 2014-07-02 LAB — CBC CANCER CENTER
BASOS PCT: 0.8 %
Basophil #: 0 x10 3/mm (ref 0.0–0.1)
Eosinophil #: 0.1 x10 3/mm (ref 0.0–0.7)
Eosinophil %: 2.3 %
HCT: 24.2 % — AB (ref 35.0–47.0)
HGB: 8 g/dL — ABNORMAL LOW (ref 12.0–16.0)
LYMPHS PCT: 13.4 %
Lymphocyte #: 0.9 x10 3/mm — ABNORMAL LOW (ref 1.0–3.6)
MCH: 30.5 pg (ref 26.0–34.0)
MCHC: 33 g/dL (ref 32.0–36.0)
MCV: 93 fL (ref 80–100)
MONOS PCT: 26.2 %
Monocyte #: 1.7 x10 3/mm — ABNORMAL HIGH (ref 0.2–0.9)
Neutrophil #: 3.7 x10 3/mm (ref 1.4–6.5)
Neutrophil %: 57.3 %
Platelet: 37 x10 3/mm — ABNORMAL LOW (ref 150–440)
RBC: 2.61 10*6/uL — ABNORMAL LOW (ref 3.80–5.20)
RDW: 19.2 % — AB (ref 11.5–14.5)
WBC: 6.4 x10 3/mm (ref 3.6–11.0)

## 2014-07-03 LAB — COMPREHENSIVE METABOLIC PANEL
ALT: 105 U/L — AB
Albumin: 3.2 g/dL — ABNORMAL LOW (ref 3.4–5.0)
Alkaline Phosphatase: 203 U/L — ABNORMAL HIGH
Anion Gap: 12 (ref 7–16)
BUN: 63 mg/dL — AB (ref 7–18)
Bilirubin,Total: 0.4 mg/dL (ref 0.2–1.0)
CALCIUM: 10.8 mg/dL — AB (ref 8.5–10.1)
CHLORIDE: 108 mmol/L — AB (ref 98–107)
CO2: 18 mmol/L — AB (ref 21–32)
CREATININE: 5.04 mg/dL — AB (ref 0.60–1.30)
EGFR (African American): 11 — ABNORMAL LOW
EGFR (Non-African Amer.): 9 — ABNORMAL LOW
Glucose: 173 mg/dL — ABNORMAL HIGH (ref 65–99)
Osmolality: 298 (ref 275–301)
POTASSIUM: 4.2 mmol/L (ref 3.5–5.1)
SGOT(AST): 69 U/L — ABNORMAL HIGH (ref 15–37)
Sodium: 138 mmol/L (ref 136–145)
Total Protein: 8.3 g/dL — ABNORMAL HIGH (ref 6.4–8.2)

## 2014-07-03 LAB — CBC WITH DIFFERENTIAL/PLATELET
BASOS ABS: 0.1 10*3/uL (ref 0.0–0.1)
BASOS PCT: 0.7 %
EOS PCT: 0.9 %
Eosinophil #: 0.1 10*3/uL (ref 0.0–0.7)
HCT: 22.5 % — ABNORMAL LOW (ref 35.0–47.0)
HGB: 7.5 g/dL — ABNORMAL LOW (ref 12.0–16.0)
LYMPHS ABS: 0.8 10*3/uL — AB (ref 1.0–3.6)
Lymphocyte %: 10.5 %
MCH: 30.7 pg (ref 26.0–34.0)
MCHC: 33.6 g/dL (ref 32.0–36.0)
MCV: 92 fL (ref 80–100)
MONO ABS: 0.8 x10 3/mm (ref 0.2–0.9)
MONOS PCT: 10.4 %
Neutrophil #: 5.8 10*3/uL (ref 1.4–6.5)
Neutrophil %: 77.5 %
Platelet: 36 10*3/uL — ABNORMAL LOW (ref 150–440)
RBC: 2.45 10*6/uL — AB (ref 3.80–5.20)
RDW: 18.7 % — ABNORMAL HIGH (ref 11.5–14.5)
WBC: 7.5 10*3/uL (ref 3.6–11.0)

## 2014-07-04 LAB — BASIC METABOLIC PANEL
ANION GAP: 10 (ref 7–16)
BUN: 62 mg/dL — AB (ref 7–18)
CALCIUM: 9.2 mg/dL (ref 8.5–10.1)
Chloride: 110 mmol/L — ABNORMAL HIGH (ref 98–107)
Co2: 18 mmol/L — ABNORMAL LOW (ref 21–32)
Creatinine: 4.72 mg/dL — ABNORMAL HIGH (ref 0.60–1.30)
EGFR (African American): 12 — ABNORMAL LOW
EGFR (Non-African Amer.): 10 — ABNORMAL LOW
Glucose: 102 mg/dL — ABNORMAL HIGH (ref 65–99)
Osmolality: 293 (ref 275–301)
POTASSIUM: 4.3 mmol/L (ref 3.5–5.1)
SODIUM: 138 mmol/L (ref 136–145)

## 2014-07-04 LAB — CBC WITH DIFFERENTIAL/PLATELET
BASOS ABS: 0.1 10*3/uL (ref 0.0–0.1)
BASOS PCT: 1.3 %
Eosinophil #: 0 10*3/uL (ref 0.0–0.7)
Eosinophil %: 0.1 %
HCT: 21.3 % — ABNORMAL LOW (ref 35.0–47.0)
HGB: 7.1 g/dL — ABNORMAL LOW (ref 12.0–16.0)
Lymphocyte #: 0.6 10*3/uL — ABNORMAL LOW (ref 1.0–3.6)
Lymphocyte %: 6.1 %
MCH: 30.8 pg (ref 26.0–34.0)
MCHC: 33.2 g/dL (ref 32.0–36.0)
MCV: 93 fL (ref 80–100)
Monocyte #: 1.5 x10 3/mm — ABNORMAL HIGH (ref 0.2–0.9)
Monocyte %: 15 %
NEUTROS ABS: 7.7 10*3/uL — AB (ref 1.4–6.5)
Neutrophil %: 77.5 %
Platelet: 39 10*3/uL — ABNORMAL LOW (ref 150–440)
RBC: 2.3 10*6/uL — ABNORMAL LOW (ref 3.80–5.20)
RDW: 18.8 % — AB (ref 11.5–14.5)
WBC: 10 10*3/uL (ref 3.6–11.0)

## 2014-07-04 LAB — TROPONIN I
Troponin-I: 0.02 ng/mL
Troponin-I: <0.02 ng/mL

## 2014-07-05 LAB — CBC WITH DIFFERENTIAL/PLATELET
BANDS NEUTROPHIL: 5 %
HCT: 21.3 % — ABNORMAL LOW (ref 35.0–47.0)
HGB: 6.7 g/dL — ABNORMAL LOW (ref 12.0–16.0)
LYMPHS PCT: 9 %
MCH: 29.7 pg (ref 26.0–34.0)
MCHC: 31.5 g/dL — ABNORMAL LOW (ref 32.0–36.0)
MCV: 94 fL (ref 80–100)
MONOS PCT: 13 %
Metamyelocyte: 2 %
NRBC/100 WBC: 1 /
Platelet: 47 10*3/uL — ABNORMAL LOW (ref 150–440)
RBC: 2.27 10*6/uL — ABNORMAL LOW (ref 3.80–5.20)
RDW: 19.3 % — ABNORMAL HIGH (ref 11.5–14.5)
SEGMENTED NEUTROPHILS: 68 %
Variant Lymphocyte - H1-Rlymph: 3 %
WBC: 9.4 10*3/uL (ref 3.6–11.0)

## 2014-07-05 LAB — BASIC METABOLIC PANEL
Anion Gap: 10 (ref 7–16)
BUN: 61 mg/dL — AB (ref 7–18)
CALCIUM: 8.2 mg/dL — AB (ref 8.5–10.1)
CREATININE: 3.99 mg/dL — AB (ref 0.60–1.30)
Chloride: 112 mmol/L — ABNORMAL HIGH (ref 98–107)
Co2: 16 mmol/L — ABNORMAL LOW (ref 21–32)
EGFR (Non-African Amer.): 12 — ABNORMAL LOW
GFR CALC AF AMER: 14 — AB
GLUCOSE: 83 mg/dL (ref 65–99)
Osmolality: 292 (ref 275–301)
Potassium: 4.2 mmol/L (ref 3.5–5.1)
Sodium: 138 mmol/L (ref 136–145)

## 2014-07-06 LAB — CBC WITH DIFFERENTIAL/PLATELET
BASOS ABS: 0.1 10*3/uL (ref 0.0–0.1)
Basophil %: 1.5 %
EOS PCT: 0 %
Eosinophil #: 0 10*3/uL (ref 0.0–0.7)
HCT: 28 % — ABNORMAL LOW (ref 35.0–47.0)
HGB: 9.3 g/dL — ABNORMAL LOW (ref 12.0–16.0)
LYMPHS ABS: 0.6 10*3/uL — AB (ref 1.0–3.6)
LYMPHS PCT: 7.9 %
MCH: 30.2 pg (ref 26.0–34.0)
MCHC: 33.1 g/dL (ref 32.0–36.0)
MCV: 91 fL (ref 80–100)
Monocyte #: 1.8 x10 3/mm — ABNORMAL HIGH (ref 0.2–0.9)
Monocyte %: 25.2 %
Neutrophil #: 4.7 10*3/uL (ref 1.4–6.5)
Neutrophil %: 65.4 %
Platelet: 42 10*3/uL — ABNORMAL LOW (ref 150–440)
RBC: 3.07 10*6/uL — AB (ref 3.80–5.20)
RDW: 17 % — AB (ref 11.5–14.5)
WBC: 7.2 10*3/uL (ref 3.6–11.0)

## 2014-07-06 LAB — BASIC METABOLIC PANEL
Anion Gap: 8 (ref 7–16)
BUN: 62 mg/dL — AB (ref 7–18)
CHLORIDE: 112 mmol/L — AB (ref 98–107)
CO2: 18 mmol/L — AB (ref 21–32)
Calcium, Total: 7.9 mg/dL — ABNORMAL LOW (ref 8.5–10.1)
Creatinine: 3.74 mg/dL — ABNORMAL HIGH (ref 0.60–1.30)
EGFR (Non-African Amer.): 12 — ABNORMAL LOW
GFR CALC AF AMER: 15 — AB
GLUCOSE: 99 mg/dL (ref 65–99)
Osmolality: 293 (ref 275–301)
Potassium: 4 mmol/L (ref 3.5–5.1)
SODIUM: 138 mmol/L (ref 136–145)

## 2014-07-07 LAB — CBC WITH DIFFERENTIAL/PLATELET
HCT: 25.3 % — ABNORMAL LOW (ref 35.0–47.0)
HGB: 8.5 g/dL — AB (ref 12.0–16.0)
Lymphocytes: 18 %
MCH: 30.6 pg (ref 26.0–34.0)
MCHC: 33.6 g/dL (ref 32.0–36.0)
MCV: 91 fL (ref 80–100)
MONOS PCT: 8 %
Metamyelocyte: 1 %
PLATELETS: 35 10*3/uL — AB (ref 150–440)
RBC: 2.78 10*6/uL — AB (ref 3.80–5.20)
RDW: 17.2 % — ABNORMAL HIGH (ref 11.5–14.5)
Segmented Neutrophils: 73 %
WBC: 5.9 10*3/uL (ref 3.6–11.0)

## 2014-07-07 LAB — BASIC METABOLIC PANEL
Anion Gap: 8 (ref 7–16)
BUN: 57 mg/dL — ABNORMAL HIGH (ref 7–18)
CALCIUM: 7.2 mg/dL — AB (ref 8.5–10.1)
CO2: 17 mmol/L — AB (ref 21–32)
Chloride: 116 mmol/L — ABNORMAL HIGH (ref 98–107)
Creatinine: 3.39 mg/dL — ABNORMAL HIGH (ref 0.60–1.30)
EGFR (Non-African Amer.): 14 — ABNORMAL LOW
GFR CALC AF AMER: 17 — AB
Glucose: 86 mg/dL (ref 65–99)
OSMOLALITY: 296 (ref 275–301)
Potassium: 4 mmol/L (ref 3.5–5.1)
Sodium: 141 mmol/L (ref 136–145)

## 2014-07-08 LAB — BASIC METABOLIC PANEL
ANION GAP: 10 (ref 7–16)
BUN: 58 mg/dL — ABNORMAL HIGH (ref 7–18)
Calcium, Total: 6.9 mg/dL — CL (ref 8.5–10.1)
Chloride: 116 mmol/L — ABNORMAL HIGH (ref 98–107)
Co2: 15 mmol/L — ABNORMAL LOW (ref 21–32)
Creatinine: 2.91 mg/dL — ABNORMAL HIGH (ref 0.60–1.30)
EGFR (African American): 20 — ABNORMAL LOW
EGFR (Non-African Amer.): 17 — ABNORMAL LOW
Glucose: 85 mg/dL (ref 65–99)
OSMOLALITY: 297 (ref 275–301)
POTASSIUM: 3.8 mmol/L (ref 3.5–5.1)
SODIUM: 141 mmol/L (ref 136–145)

## 2014-07-08 LAB — CBC WITH DIFFERENTIAL/PLATELET
Bands: 5 %
HCT: 26.1 % — ABNORMAL LOW (ref 35.0–47.0)
HGB: 8.7 g/dL — AB (ref 12.0–16.0)
Lymphocytes: 13 %
MCH: 30.4 pg (ref 26.0–34.0)
MCHC: 33.3 g/dL (ref 32.0–36.0)
MCV: 91 fL (ref 80–100)
Monocytes: 4 %
Myelocyte: 1 %
NRBC/100 WBC: 1 /
Platelet: 40 10*3/uL — ABNORMAL LOW (ref 150–440)
RBC: 2.86 10*6/uL — AB (ref 3.80–5.20)
RDW: 17.2 % — ABNORMAL HIGH (ref 11.5–14.5)
SEGMENTED NEUTROPHILS: 77 %
WBC: 5.4 10*3/uL (ref 3.6–11.0)

## 2014-07-09 ENCOUNTER — Ambulatory Visit: Payer: Self-pay | Admitting: Oncology

## 2014-07-09 LAB — PROT IMMUNOELECTROPHORES(ARMC)

## 2014-07-09 LAB — KAPPA/LAMBDA FREE LIGHT CHAINS (ARMC)

## 2014-07-13 ENCOUNTER — Ambulatory Visit: Payer: Self-pay | Admitting: Oncology

## 2014-07-13 LAB — COMPREHENSIVE METABOLIC PANEL
ALK PHOS: 142 U/L — AB
ALT: 58 U/L
ANION GAP: 15 (ref 7–16)
Albumin: 3.3 g/dL — ABNORMAL LOW (ref 3.4–5.0)
BUN: 59 mg/dL — ABNORMAL HIGH (ref 7–18)
Bilirubin,Total: 0.3 mg/dL (ref 0.2–1.0)
CALCIUM: 6.9 mg/dL — AB (ref 8.5–10.1)
CHLORIDE: 110 mmol/L — AB (ref 98–107)
CO2: 15 mmol/L — AB (ref 21–32)
CREATININE: 2.51 mg/dL — AB (ref 0.60–1.30)
EGFR (African American): 24 — ABNORMAL LOW
EGFR (Non-African Amer.): 20 — ABNORMAL LOW
Glucose: 152 mg/dL — ABNORMAL HIGH (ref 65–99)
Osmolality: 299 (ref 275–301)
Potassium: 4.2 mmol/L (ref 3.5–5.1)
SGOT(AST): 38 U/L — ABNORMAL HIGH (ref 15–37)
SODIUM: 140 mmol/L (ref 136–145)
Total Protein: 7.7 g/dL (ref 6.4–8.2)

## 2014-07-13 LAB — CBC CANCER CENTER
Basophil #: 0 x10 3/mm (ref 0.0–0.1)
Basophil %: 0.4 %
EOS ABS: 0 x10 3/mm (ref 0.0–0.7)
EOS PCT: 0.1 %
HCT: 29.8 % — AB (ref 35.0–47.0)
HGB: 9.9 g/dL — ABNORMAL LOW (ref 12.0–16.0)
Lymphocyte #: 0.6 x10 3/mm — ABNORMAL LOW (ref 1.0–3.6)
Lymphocyte %: 7.8 %
MCH: 30.3 pg (ref 26.0–34.0)
MCHC: 33.1 g/dL (ref 32.0–36.0)
MCV: 92 fL (ref 80–100)
MONO ABS: 0.9 x10 3/mm (ref 0.2–0.9)
Monocyte %: 11 %
NEUTROS PCT: 80.7 %
Neutrophil #: 6.7 x10 3/mm — ABNORMAL HIGH (ref 1.4–6.5)
Platelet: 59 x10 3/mm — ABNORMAL LOW (ref 150–440)
RBC: 3.25 10*6/uL — AB (ref 3.80–5.20)
RDW: 17.7 % — ABNORMAL HIGH (ref 11.5–14.5)
WBC: 8.3 x10 3/mm (ref 3.6–11.0)

## 2014-07-13 LAB — MAGNESIUM: Magnesium: 1.4 mg/dL — ABNORMAL LOW

## 2014-07-20 LAB — COMPREHENSIVE METABOLIC PANEL
ALBUMIN: 3.2 g/dL — AB (ref 3.4–5.0)
ALK PHOS: 122 U/L — AB
ANION GAP: 9 (ref 7–16)
BILIRUBIN TOTAL: 0.4 mg/dL (ref 0.2–1.0)
BUN: 47 mg/dL — ABNORMAL HIGH (ref 7–18)
CHLORIDE: 110 mmol/L — AB (ref 98–107)
CREATININE: 2.28 mg/dL — AB (ref 0.60–1.30)
Calcium, Total: 6.9 mg/dL — CL (ref 8.5–10.1)
Co2: 20 mmol/L — ABNORMAL LOW (ref 21–32)
EGFR (African American): 27 — ABNORMAL LOW
GFR CALC NON AF AMER: 22 — AB
Glucose: 103 mg/dL — ABNORMAL HIGH (ref 65–99)
Osmolality: 290 (ref 275–301)
Potassium: 4.8 mmol/L (ref 3.5–5.1)
SGOT(AST): 43 U/L — ABNORMAL HIGH (ref 15–37)
SGPT (ALT): 61 U/L
Sodium: 139 mmol/L (ref 136–145)
TOTAL PROTEIN: 7.3 g/dL (ref 6.4–8.2)

## 2014-07-20 LAB — CBC CANCER CENTER
Basophil #: 0.1 x10 3/mm (ref 0.0–0.1)
Basophil %: 1.2 %
EOS ABS: 0 x10 3/mm (ref 0.0–0.7)
Eosinophil %: 0.3 %
HCT: 27.4 % — ABNORMAL LOW (ref 35.0–47.0)
HGB: 8.9 g/dL — AB (ref 12.0–16.0)
LYMPHS ABS: 0.6 x10 3/mm — AB (ref 1.0–3.6)
Lymphocyte %: 10.6 %
MCH: 30.4 pg (ref 26.0–34.0)
MCHC: 32.3 g/dL (ref 32.0–36.0)
MCV: 94 fL (ref 80–100)
Monocyte #: 0.9 x10 3/mm (ref 0.2–0.9)
Monocyte %: 16.6 %
NEUTROS PCT: 71.3 %
Neutrophil #: 3.9 x10 3/mm (ref 1.4–6.5)
Platelet: 84 x10 3/mm — ABNORMAL LOW (ref 150–440)
RBC: 2.92 10*6/uL — ABNORMAL LOW (ref 3.80–5.20)
RDW: 18.4 % — ABNORMAL HIGH (ref 11.5–14.5)
WBC: 5.5 x10 3/mm (ref 3.6–11.0)

## 2014-07-20 LAB — MAGNESIUM: Magnesium: 1.3 mg/dL — ABNORMAL LOW

## 2014-07-27 LAB — BASIC METABOLIC PANEL
ANION GAP: 13 (ref 7–16)
BUN: 56 mg/dL — AB (ref 7–18)
CALCIUM: 8.8 mg/dL (ref 8.5–10.1)
CHLORIDE: 104 mmol/L (ref 98–107)
Co2: 18 mmol/L — ABNORMAL LOW (ref 21–32)
Creatinine: 2.61 mg/dL — ABNORMAL HIGH (ref 0.60–1.30)
EGFR (Non-African Amer.): 19 — ABNORMAL LOW
GFR CALC AF AMER: 23 — AB
GLUCOSE: 148 mg/dL — AB (ref 65–99)
OSMOLALITY: 288 (ref 275–301)
Potassium: 5.4 mmol/L — ABNORMAL HIGH (ref 3.5–5.1)
Sodium: 135 mmol/L — ABNORMAL LOW (ref 136–145)

## 2014-07-27 LAB — CBC CANCER CENTER
BASOS ABS: 0.1 x10 3/mm (ref 0.0–0.1)
Basophil %: 1 %
EOS ABS: 0 x10 3/mm (ref 0.0–0.7)
EOS PCT: 0.2 %
HCT: 26.7 % — AB (ref 35.0–47.0)
HGB: 8.4 g/dL — AB (ref 12.0–16.0)
LYMPHS ABS: 0.9 x10 3/mm — AB (ref 1.0–3.6)
LYMPHS PCT: 11.9 %
MCH: 29.9 pg (ref 26.0–34.0)
MCHC: 31.4 g/dL — AB (ref 32.0–36.0)
MCV: 95 fL (ref 80–100)
Monocyte #: 0.9 x10 3/mm (ref 0.2–0.9)
Monocyte %: 12 %
NEUTROS ABS: 5.4 x10 3/mm (ref 1.4–6.5)
NEUTROS PCT: 74.9 %
Platelet: 104 x10 3/mm — ABNORMAL LOW (ref 150–440)
RBC: 2.79 10*6/uL — ABNORMAL LOW (ref 3.80–5.20)
RDW: 19.2 % — ABNORMAL HIGH (ref 11.5–14.5)
WBC: 7.2 x10 3/mm (ref 3.6–11.0)

## 2014-08-03 LAB — CBC CANCER CENTER
Basophil #: 0 x10 3/mm (ref 0.0–0.1)
Basophil %: 0.3 %
EOS ABS: 0.1 x10 3/mm (ref 0.0–0.7)
Eosinophil %: 0.8 %
HCT: 22.7 % — ABNORMAL LOW (ref 35.0–47.0)
HGB: 7.3 g/dL — AB (ref 12.0–16.0)
Lymphocyte #: 5.1 x10 3/mm — ABNORMAL HIGH (ref 1.0–3.6)
Lymphocyte %: 37.6 %
MCH: 30.3 pg (ref 26.0–34.0)
MCHC: 32.4 g/dL (ref 32.0–36.0)
MCV: 93 fL (ref 80–100)
Monocyte #: 1.2 x10 3/mm — ABNORMAL HIGH (ref 0.2–0.9)
Monocyte %: 8.8 %
NEUTROS ABS: 7.1 x10 3/mm — AB (ref 1.4–6.5)
NEUTROS PCT: 52.5 %
Platelet: 58 x10 3/mm — ABNORMAL LOW (ref 150–440)
RBC: 2.43 10*6/uL — ABNORMAL LOW (ref 3.80–5.20)
RDW: 19.3 % — ABNORMAL HIGH (ref 11.5–14.5)
WBC: 13.5 x10 3/mm — AB (ref 3.6–11.0)

## 2014-08-06 LAB — CREATININE, SERUM
Creatinine: 7.52 mg/dL — ABNORMAL HIGH (ref 0.60–1.30)
EGFR (African American): 7 — ABNORMAL LOW
GFR CALC NON AF AMER: 6 — AB

## 2014-08-06 LAB — CALCIUM: Calcium, Total: 7.9 mg/dL — ABNORMAL LOW (ref 8.5–10.1)

## 2014-08-06 LAB — CANCER CENTER HEMOGLOBIN: HGB: 8.3 g/dL — AB (ref 12.0–16.0)

## 2014-08-07 ENCOUNTER — Inpatient Hospital Stay: Payer: Self-pay | Admitting: Oncology

## 2014-08-07 LAB — CBC WITH DIFFERENTIAL/PLATELET
Bands: 18 %
HCT: 24.3 % — ABNORMAL LOW (ref 35.0–47.0)
HGB: 7.8 g/dL — ABNORMAL LOW (ref 12.0–16.0)
Lymphocytes: 33 %
MCH: 30.3 pg (ref 26.0–34.0)
MCHC: 31.9 g/dL — ABNORMAL LOW (ref 32.0–36.0)
MCV: 95 fL (ref 80–100)
MONOS PCT: 5 %
NRBC/100 WBC: 7 /
Platelet: 59 10*3/uL — ABNORMAL LOW (ref 150–440)
RBC: 2.56 10*6/uL — ABNORMAL LOW (ref 3.80–5.20)
RDW: 18.3 % — ABNORMAL HIGH (ref 11.5–14.5)
Segmented Neutrophils: 44 %
WBC: 13.6 10*3/uL — ABNORMAL HIGH (ref 3.6–11.0)

## 2014-08-07 LAB — URINALYSIS, COMPLETE
BLOOD: NEGATIVE
Bilirubin,UR: NEGATIVE
GLUCOSE, UR: NEGATIVE mg/dL (ref 0–75)
Ketone: NEGATIVE
NITRITE: NEGATIVE
Ph: 5 (ref 4.5–8.0)
Protein: 30
RBC,UR: 1 /HPF (ref 0–5)
SQUAMOUS EPITHELIAL: NONE SEEN
Specific Gravity: 1.011 (ref 1.003–1.030)
WBC UR: 71 /HPF (ref 0–5)

## 2014-08-07 LAB — BASIC METABOLIC PANEL
ANION GAP: 10 (ref 7–16)
BUN: 91 mg/dL — ABNORMAL HIGH (ref 7–18)
CHLORIDE: 109 mmol/L — AB (ref 98–107)
CO2: 18 mmol/L — AB (ref 21–32)
Calcium, Total: 8.1 mg/dL — ABNORMAL LOW (ref 8.5–10.1)
Creatinine: 7.26 mg/dL — ABNORMAL HIGH (ref 0.60–1.30)
EGFR (African American): 7 — ABNORMAL LOW
EGFR (Non-African Amer.): 6 — ABNORMAL LOW
GLUCOSE: 101 mg/dL — AB (ref 65–99)
Osmolality: 302 (ref 275–301)
POTASSIUM: 5 mmol/L (ref 3.5–5.1)
Sodium: 137 mmol/L (ref 136–145)

## 2014-08-08 LAB — BASIC METABOLIC PANEL
Anion Gap: 12 (ref 7–16)
BUN: 94 mg/dL — ABNORMAL HIGH (ref 7–18)
Calcium, Total: 7.5 mg/dL — ABNORMAL LOW (ref 8.5–10.1)
Chloride: 111 mmol/L — ABNORMAL HIGH (ref 98–107)
Co2: 13 mmol/L — ABNORMAL LOW (ref 21–32)
Creatinine: 6.76 mg/dL — ABNORMAL HIGH (ref 0.60–1.30)
EGFR (African American): 8 — ABNORMAL LOW
EGFR (Non-African Amer.): 6 — ABNORMAL LOW
GLUCOSE: 139 mg/dL — AB (ref 65–99)
OSMOLALITY: 303 (ref 275–301)
Potassium: 5.7 mmol/L — ABNORMAL HIGH (ref 3.5–5.1)
SODIUM: 136 mmol/L (ref 136–145)

## 2014-08-09 ENCOUNTER — Ambulatory Visit: Payer: Self-pay | Admitting: Oncology

## 2014-08-09 LAB — POTASSIUM: Potassium: 4.1 mmol/L (ref 3.5–5.1)

## 2014-08-10 LAB — BASIC METABOLIC PANEL
ANION GAP: 19 — AB (ref 7–16)
BUN: 95 mg/dL — AB (ref 7–18)
CALCIUM: 6.6 mg/dL — AB (ref 8.5–10.1)
CHLORIDE: 113 mmol/L — AB (ref 98–107)
Co2: 13 mmol/L — ABNORMAL LOW (ref 21–32)
Creatinine: 5.44 mg/dL — ABNORMAL HIGH (ref 0.60–1.30)
EGFR (African American): 10 — ABNORMAL LOW
EGFR (Non-African Amer.): 8 — ABNORMAL LOW
Glucose: 118 mg/dL — ABNORMAL HIGH (ref 65–99)
Osmolality: 319 (ref 275–301)
POTASSIUM: 3.7 mmol/L (ref 3.5–5.1)
SODIUM: 145 mmol/L (ref 136–145)

## 2014-08-10 LAB — HEMOGLOBIN: HGB: 6.8 g/dL — ABNORMAL LOW (ref 12.0–16.0)

## 2014-08-11 LAB — URINE CULTURE

## 2014-08-12 ENCOUNTER — Ambulatory Visit: Payer: Self-pay | Admitting: Oncology

## 2014-08-12 LAB — CULTURE, BLOOD (SINGLE)

## 2014-09-08 ENCOUNTER — Ambulatory Visit: Payer: Self-pay | Admitting: Oncology

## 2014-09-08 DEATH — deceased

## 2015-01-30 NOTE — Consult Note (Signed)
Reason for Visit: This 81 year old Female patient presents to the clinic for initial evaluation of  back pain and multiple myeloma.   Referred by Dr. Oliva Bustard.  Diagnosis:  Chief Complaint/Diagnosis   Patient presently complains of pain in mid back area  HPI   This is a delightful 81 yo woman with a history ultiple myeloma diagnosed in August of 2012. At that time, she had anemia, renal failure, and 60% plasma cells in bone marrow.  Skeletal survey then showed osteopenia. She received chemotherapy with Velcade, Decadron, or Revlamid from August 17 to September 14 of 2012  Patient was hospitalized with renal failure and did not want to continue revlamid September 2012.  She continued Velcade and Decadron. She got CY-BOR -D, then [place] February 05, 2013-May 20, 2013 and then Velcade maintenance until January of 2015.  She received Kuorilis , Cytoxan in February of 2015.  Cytoxan was discontinued in may [id number]  because of increasing myelosuppression early August the patient presented with pain in mid back area that she grades 10/10.  Plain films show new compression fracture of T6 and T8.  She has been referred today for possible palliative radiotherapy.  Past Hx:    Other, see comments: Multiple myeloma   Hypertension:   Past, Family and Social History:  Family History negative   Social History negative   Allergies:   Sulfa drugs: Hives  Home Meds:  Home Medications: Medication Instructions Status  Duragesic-25 25 mcg/hr transdermal film, extended release 1 patch transdermal every 72 hours Active  Prilosec 20 mg oral delayed release capsule 1 cap(s) orally 2 times a day Active  metoprolol tartrate 50 mg oral tablet 1 tab(s) orally once a day x 30 days Active  amlodipine 10 mg oral tablet 1 tab(s) orally once a day Active  enalapril 10 mg oral tablet 1 tab(s) orally 2 times a day Active  Centrum Silver Therapeutic Multiple Vitamins with Minerals oral tablet 1  tab(s) orally once a day Active  Vitamin D3 1 cap(s) orally once a day Active  hydrALAZINE 25 mg oral tablet 1 tab(s) orally 3 times a day Active  biotin 5000 mcg oral tablet, disintegrating 1 tab(s) orally once a day Active   Review of Systems:  General Symptoms fatigue   Performance Status (ECOG) 1   Negative ENMT Symptoms no nose bleeds  no gum bleeding   Respiratory and Thorax negative   Cardiovascular negative   Gastrointestinal negative   Genitourinary negative   Musculoskeletal Symptoms back pain   Neurological negative   Psychiatric negative   Hematology/Lymphatics negative   Endocrine not asked   Allergic/Immunologic not asked   Nursing Notes:  Nursing Vital Signs and Chemo Nursing Nursing Notes: *CC Vital Signs Flowsheet:   04-Mar-15 08:47  Temp Temperature 96  Pulse Pulse 92  Respirations Respirations 18  SBP SBP 157  DBP DBP 60  Pain Scale (0-10)  0  Current Weight (kg) (kg) 76.9  Height (cm) centimeters 152.4  BSA (m2) 1.7    11-Mar-15 09:13  Temp Temperature 97.8  Pulse Pulse 107  Respirations Respirations 18  SBP SBP 177  DBP DBP 64  Pain Scale (0-10)  0  Current Weight (kg) (kg) 76.2  Height (cm) centimeters 152.4  BSA (m2) 1.7    12-Mar-15 08:54  Temp Temperature 97.2  Temperature Source  tympanic  Pulse Pulse 87  Respirations Respirations 18  SBP SBP 155  DBP DBP 70  Pain Scale (0-10)  0  18-Mar-15 08:27  Temp Temperature 97  Pulse Pulse 97  Respirations Respirations 18  SBP SBP 163  DBP DBP 71  Pain Scale (0-10)  0  Current Weight (kg) (kg) 76.6  Height (cm) centimeters 152.4  BSA (m2) 1.7    09:34  Vital Signs Type Vital Signs Type Recheck  Pulse Pulse 97  Respirations Respirations 18  SBP SBP 148  DBP DBP 72    19-Mar-15 09:02  Vital Signs Type Vital Signs Type Pre Chemo  Temp Temperature 96.4  Pulse Pulse 80  Respirations Respirations 18  SBP SBP 157  DBP DBP 71    27-Mar-15 11:28  Pulse Pulse 79   Respirations Respirations 18  SBP SBP 158  DBP DBP 68    01-Apr-15 08:34  Temp Temperature 96.5  Pulse Pulse 96  Respirations Respirations 18  SBP SBP 159  DBP DBP 65  Pain Scale (0-10)  0  Current Weight (kg) (kg) 76.4  Height (cm) centimeters 152.4  BSA (m2) 1.7    08-Apr-15 09:32  Temp Temperature 97.2  Pulse Pulse 85  Respirations Respirations 18  SBP SBP 159  DBP DBP 70  Pain Scale (0-10)  0  Current Weight (kg) (kg) 75.7  Height (cm) centimeters 152.4  BSA (m2) 1.7    11:47  Vital Signs Type Vital Signs Type Pre Chemo  Pulse Pulse 68  Respirations Respirations 20  SBP SBP 128  DBP DBP 78  Pain Scale (0-10)  0    09-Apr-15 08:43  Temp Temperature 97.4  Temperature Source  tympanic  Pulse Pulse 83  Respirations Respirations 18  SBP SBP 155  DBP DBP 60    15-Apr-15 09:29  Temp Temperature 96.7  Pulse Pulse 85  Respirations Respirations 18  SBP SBP 164  DBP DBP 75  Pain Scale (0-10)  0  Current Weight (kg) (kg) 76.1  Height (cm) centimeters 152.4  BSA (m2) 1.7    10:13  Vital Signs Type Vital Signs Type Recheck  SBP SBP 157  DBP DBP 69    22-Apr-15 10:12  Temp Temperature 96.1  Pulse Pulse 93  Respirations Respirations 18  SBP SBP 154  DBP DBP 66  Pain Scale (0-10)  0  Current Weight (kg) (kg) 75.4  Height (cm) centimeters 152.4  BSA (m2) 1.7    23-Apr-15 08:50  Vital Signs Type Vital Signs Type Routine; Pre Chemo  Temp Temperature 97  Temperature Source  tympanic  Pulse Pulse 97  Respirations Respirations 18  SBP SBP 154  DBP DBP 67  Pain Scale (0-10)  0    29-Apr-15 08:53  Temp Temperature 96.9  Pulse Pulse 90  Respirations Respirations 18  SBP SBP 150  DBP DBP 79  Pain Scale (0-10)  0  Current Weight (kg) (kg) 75.1  Height (cm) centimeters 152.4  BSA (m2) 1.7    12:01  Vital Signs Type Vital Signs Type Post Chemo  Pulse Pulse 73  Respirations Respirations 18  SBP SBP 156  DBP DBP 70    06-May-15 08:46  Temp Temperature 97   Pulse Pulse 96  Respirations Respirations 18  SBP SBP 177  DBP DBP 65  Pain Scale (0-10)  0  Current Weight (kg) (kg) 75.5  Height (cm) centimeters 152.4  BSA (m2) 1.7    09:47  Vital Signs Type Vital Signs Type Recheck  Pulse Pulse 88  Respirations Respirations 18  SBP SBP 155  DBP DBP 65  Pain Scale (0-10)  0  13-May-15 08:57  Temp Temperature 96.5  Pulse Pulse 83  Respirations Respirations 22  SBP SBP 146  DBP DBP 65  Pain Scale (0-10)  0  Pulse Oxi  98  Current Weight (kg) (kg) 74.8  Height (cm) centimeters 152.4  BSA (m2) 1.7    20-May-15 09:23  Temp Temperature 97.1  Pulse Pulse 86  Respirations Respirations 18  SBP SBP 162  DBP DBP 65  Pain Scale (0-10)  0  Current Weight (kg) (kg) 74.5  Height (cm) centimeters 152.4  BSA (m2) 1.7    10:35  Vital Signs Type Vital Signs Type Recheck  Pulse Pulse 60  Respirations Respirations 18  SBP SBP 136  DBP DBP 69    12:31  Pulse Pulse 61  Respirations Respirations 18  SBP SBP 144  DBP DBP 73  Pain Scale (0-10)  0    27-May-15 09:54  Pulse Pulse 77  Respirations Respirations 18  SBP SBP 158  DBP DBP 63  Pain Scale (0-10)  0  Current Weight (kg) (kg) 75.4  Height (cm) centimeters 152.4  BSA (m2) 1.7    13:35  Respirations Respirations 18  SBP SBP 158  DBP DBP 72    28-May-15 08:59  Pulse Pulse 71  Respirations Respirations 18  SBP SBP 173  DBP DBP 74  Pain Scale (0-10)  0    11:15  Pulse Pulse 65  Respirations Respirations 18  SBP SBP 174  DBP DBP 74    03-Jun-15 10:00  Temp Temperature 96.2  Pulse Pulse 85  Respirations Respirations 18  SBP SBP 177  DBP DBP 68  Pain Scale (0-10)  0  Current Weight (kg) (kg) 76.3  Height (cm) centimeters 152.4  BSA (m2) 1.7    12:03  SBP SBP 172  DBP DBP 67    04-Jun-15 09:41  Temp Temperature 96  Temperature Source  tympanic  Pulse Pulse 67  Respirations Respirations 18  SBP SBP 171  DBP DBP 73    10:34  Pulse Pulse 61  SBP SBP 164  DBP DBP  69    11:27  Pulse Pulse 66  SBP SBP 173  DBP DBP 72    10-Jun-15 09:48  Temp Temperature 97.4  Pulse Pulse 92  Respirations Respirations 18  SBP SBP 183  DBP DBP 67  Pain Scale (0-10)  0  Current Weight (kg) (kg) 75.2  Height (cm) centimeters 152.4  BSA (m2) 1.7    10:09  Vital Signs Type Vital Signs Type Recheck  Pulse Pulse 84  Respirations Respirations 18  SBP SBP 152  DBP DBP 68    11:49  Vital Signs Type Vital Signs Type Post Chemo  Pulse Pulse 82  Respirations Respirations 18  SBP SBP 154  DBP DBP 66  Pain Scale (0-10)  0    11-Jun-15 09:33  Temp Temperature 97.4  Temperature Source  tympanic  Pulse Pulse 67  Respirations Respirations 18  SBP SBP 162  DBP DBP 67    24-Jun-15 09:34  Temp Temperature 97.3  Pulse Pulse 98  Respirations Respirations 18  SBP SBP 150  DBP DBP 69  Pain Scale (0-10)  0  Current Weight (kg) (kg) 74.5  Height (cm) centimeters 152.4  BSA (m2) 1.7    25-Jun-15 10:36  Vital Signs Type Vital Signs Type Pre Chemo  Temp Temperature 97.5  Temperature Source  tympanic  Pulse Pulse 91  Respirations Respirations 18  SBP SBP 155  DBP DBP 67  Pain  Scale (0-10)  0    01-Jul-15 10:15  Temp Temperature 98.2  Pulse Pulse 90  Respirations Respirations 20  SBP SBP 167  DBP DBP 62  Pain Scale (0-10)  0  Pulse Oxi  99  Current Weight (kg) (kg) 74.3  Height (cm) centimeters 152.4  BSA (m2) 1.7    10:57  Vital Signs Type Vital Signs Type Recheck  Pulse Pulse 81  Respirations Respirations 18  SBP SBP 149  DBP DBP 67    08-Jul-15 10:20  Temp Temperature 96.8  Pulse Pulse 84  Respirations Respirations 18  SBP SBP 154  DBP DBP 67  Pain Scale (0-10)  0  Current Weight (kg) (kg) 73.2  Height (cm) centimeters 152.4  BSA (m2) 1.7    09-Jul-15 10:50  Vital Signs Type Vital Signs Type Pre Chemo  Temp Temperature 97.7  Temperature Source  tympanic  Pulse Pulse 69  Respirations Respirations 18  SBP SBP 132  DBP DBP 63  Pain Scale  (0-10)  0    15-Jul-15 08:25  Temp Temperature 95.9  Pulse Pulse 82  Respirations Respirations 16  SBP SBP 163  DBP DBP 65  Pain Scale (0-10)  0  Current Weight (kg) (kg) 71.9  Height (cm) centimeters 152.4  BSA (m2) 1.6    16-Jul-15 10:00  Temp Temperature 96.5  Pulse Pulse 60  Respirations Respirations 18  SBP SBP 115  DBP DBP 64  Pain Scale (0-10)  0    10:58  Pulse Pulse 69  Respirations Respirations 18  SBP SBP 146  DBP DBP 71  Pain Scale (0-10)  0    29-Jul-15 08:26  Temp Temperature 96.8  Pulse Pulse 93  Respirations Respirations 18  SBP SBP 157  DBP DBP 65  Pain Scale (0-10)  0  Current Weight (kg) (kg) 72.6  Height (cm) centimeters 152.4  BSA (m2) 1.6    05-Aug-15 08:57  Temp Temperature 96.3  Pulse Pulse 92  Respirations Respirations 18  SBP SBP 162  DBP DBP 64  Pain Scale (0-10)  0  Current Weight (kg) (kg) 71.5  Height (cm) centimeters 152.4  BSA (m2) 1.6    09:32  Vital Signs Type Vital Signs Type Recheck  Pulse Pulse 67  SBP SBP 144  DBP DBP 60    12-Aug-15 14:20  Vital Signs Type Vital Signs Type Routine  Temp Temperature 96.2  Temperature Source  tympanic  Pulse Pulse 83  Respirations Respirations 18  SBP SBP 153  DBP DBP 64  Pain Scale (0-10)  0    17-Aug-15 14:22  Temp Temperature 96.5  Pulse Pulse 81  Respirations Respirations 16  SBP SBP 161  DBP DBP 65  Pain Scale (0-10)  10  Current Weight (kg) (kg) 69.7  Height (cm) centimeters 152.4  BSA (m2) 1.6    20-Aug-15 13:46  Temp Temperature 96.1  Pulse Pulse 71  Respirations Respirations 16  SBP SBP 160  DBP DBP 61  Pain Scale (0-10)  0  Current Weight (kg) (kg) 72  Height (cm) centimeters 152.4  BSA (m2) 1.6   Physical Exam:  General/Skin/HEENT:  General normal   Skin not examined   Eyes not examined   ENMT not examined   Head and Neck not examined   Breasts/Resp/CV/GI/GU:  Breasts not examined   Respiratory and Thorax not examined   Cardiovascular not  examined   Gastrointestinal not examined   Genitourinary not examined   MS/Neuro/Psych/Lymph:  Musculoskeletal Details localizes pain to  mid back   Neurological Details alert and oriented x 3; responds to verbal commands; sensation intact; normal strength   Psychiatric normal   Other Results:  Radiology Results: XRay:    17-Aug-15 16:25, Lumbar Spine Complete with Flex Ext  Lumbar Spine Complete with Flex Ext   REASON FOR EXAM:    multiple myeloma and sob  COMMENTS:       PROCEDURE: DXR - DXR LSP COMPLETE W/ FLEX/EXTEN  - May 25 2014  4:25PM     CLINICAL DATA:  Multiple myeloma.    EXAM:  LUMBAR SPINE - COMPLETE WITH BENDING VIEWS    COMPARISON:  Lumbar radiographs dated 03/19/2014    FINDINGS:  There is an increased compression fracture of the superior aspect of  L3 as compared to the prior study. There is chronic degenerative  disc and joint disease at L5-S1 with grade 1 spondylolisthesis.  There is diffuse osteopenia.     IMPRESSION:  Increased compression fracture of L3. No visible protrusion of bone  into the spinal canal.      Electronically Signed    By: Rozetta Nunnery M.D.    On: 05/25/2014 16:38         Verified By: Larey Seat, M.D.,    17-Aug-15 16:25, Thoracic Spine AP and Lateral  Thoracic Spine AP and Lateral   REASON FOR EXAM:    multiple myeloma and sob  COMMENTS:       PROCEDURE: DXR - DXR THORACIC  AP AND LATERAL  - May 25 2014  4:25PM     CLINICAL DATA:  Myeloma.  Shortness of breath.    EXAM:  THORACIC SPINE - 2 VIEW    COMPARISON:  03/19/2014    FINDINGS:  There is a new slight compression fracture of the T6 vertebral body.  There is also new slight compression fracture of the superior aspect  of T8.    No other change.     IMPRESSION:  New slight compression fractures of T6 and T8. Osteopenia. No  visible protrusion of bone into the spinal canal.      Electronically Signed    By: Rozetta Nunnery M.D.    On: 05/25/2014  16:41         Verified By: Larey Seat, M.D.,  LabUnknown:  PACS Image    Relevent Results:   Relevant Scans and Labs spine x-rays reviewed   Assessment and Plan: Impression:   This patient is a very nice 81 yo woman with a history of multiple myeloma presenting with back pain and new compression fractures of T6 and T8, suspicous for pathologic fractures.  She is neurologically intact indicating no clincal spinal cord injury. Plan:   Urgent thoracic spine MRI this evening.  Return to rad-onc to review the result and potentially initiate radiotherapy for palliation of pain and preservation of spinal cord function.  Patient scheduled for MRI later this evening and CT simulation with Dr. Lisbeth Renshaw in rad-onc tomorrow. appreciate the opportunity to participate in the care of this very nice patient.  CC Referral:  cc: Dr. Oliva Bustard   Electronic Signatures: Lora Paula (MD)  (Signed 20-Aug-15 14:55)  Authored: HPI, Diagnosis, Past Hx, PFSH, Allergies, Home Meds, ROS, Nursing Notes, Physical Exam, Other Results, Relevent Results, Encounter Assessment and Plan, CC Referring Physician   Last Updated: 20-Aug-15 14:55 by Lora Paula (MD)

## 2015-01-30 NOTE — Discharge Summary (Signed)
Dates of Admission and Diagnosis:  Date of Admission 02-Jul-2014   Date of Discharge 08-Jul-2014   Admitting Diagnosis acute renal failure   Final Diagnosis acute renal failure were do to hypercalcemia and progressing multiple myeloma Hypercalcemia secondary to progressing multiple myeloma Anemia    Chief Complaint/History of Present Illness 1. Multiple myeloma(diagnosis in August of 2012) Anemia.  Renal failure.  60% plasma cells in bone marrow. Monoclonal gammopathy. Result: Abnormal Myeloma FISH Profile Monosomy for chromosome 13 Loss/deletion of MAF DNA sequence Gain of chromosome 9 CD56+ monoclonal plasma cell population (14%), consistent with pasmacell neoplasm  skeletal survey showing osteopenia 3. Started on chemotherapy with Velcade, Decadron, or Revlamid  Zometa will be added when, kidney function improves from August 17 to September 14 of 2012 Patient was hospitalized with renal failure or and patient did not want to continue her   revlamid was discontinued (September 15 2 September 28 of 2012-hospitalization) 4..patient continued Velcade and Decadron from 06/23/2011   to  27 /11/2010 5.CY-BOR -D started on Feb 20, 2012, July 02, 2012. 6.POMOLIDOMIDE AND DECADRON February 05, 2013-May 20, 2013. 7.  Velcade maintenance from November, 2014 January of 2015. 8.  kuporilis , Cytoxan in February of 2015. 9.Cytoxan was discontinued in    may 8f2015  because of increasing myelosuppression supportive therapy:  PROCRIT  AND packed cell transfusion 10progressive  disease by rising   haracterised by rising light chain progressive anemia and progressive renal failure patient started on Doxil and Velcade May 13, 2014 palliative Radiation therapy to the spine in August of 2015 ?? HPI  81year old lady came today for further followup regarding multiple myeloma patient was having generalized itching.  Serum creatinine was more than 5.  Calcium was 11.3.  Patient has progressing  multiple myeloma in view of hypercalcemia progressive renal failure and thrombocytopenia patient was admitted in the hospital for further evaluation and treatment consideration. Chronic anemia on Procrit therapy. Patient is feeling dizzy and weak.  Has a chronic hypertension   Allergies:  Sulfa drugs: Hives  Routine Chem:  29-Sep-15 04:24   Glucose, Serum 86  BUN  57  Creatinine (comp)  3.39  Sodium, Serum 141  Potassium, Serum 4.0  Chloride, Serum  116  CO2, Serum  17  Calcium (Total), Serum  7.2  Anion Gap 8  Osmolality (calc) 296  eGFR (African American)  17  eGFR (Non-African American)  14 (eGFR values <677mmin/1.73 m2 may be an indication of chronic kidney disease (CKD). Calculated eGFR, using the MRDR Study equation, is useful in  patients with stable renal function. The eGFR calculation will not be reliable in acutely ill patients when serum creatinine is changing rapidly. It is not useful in patients on dialysis. The eGFR calculation may not be applicable to patients at the low and high extremes of body sizes, pregnant women, and vetetarians.)  30-Sep-15 05:19   Glucose, Serum 85  BUN  58  Creatinine (comp)  2.91  Sodium, Serum 141  Potassium, Serum 3.8  Chloride, Serum  116  CO2, Serum  15  Calcium (Total), Serum  6.9  Anion Gap 10  Osmolality (calc) 297  eGFR (African American)  20  eGFR (Non-African American)  17 (eGFR values <6026min/1.73 m2 may be an indication of chronic kidney disease (CKD). Calculated eGFR, using the MRDR Study equation, is useful in  patients with stable renal function. The eGFR calculation will not be reliable in acutely ill patients when serum creatinine is changing rapidly. It is not  useful in patients on dialysis. The eGFR calculation may not be applicable to patients at the low and high extremes of body sizes, pregnant women, and vetetarians.)  Result Comment CALCIUM - NOTIFIED OF CRITICAL VALUE  - RESULTS VERIFIED BY REPEAT  TESTING.  - CALLED TO CARMEN HERRERA AT 4239 ON  - 07/08/2014.Marland KitchenMarland KitchenTPL  - READ-BACK PROCESS PERFORMED.  Result(s) reported on 08 Jul 2014 at 06:11AM.  Routine Hem:  29-Sep-15 04:24   WBC (CBC) 5.9  RBC (CBC)  2.78  Hemoglobin (CBC)  8.5  Hematocrit (CBC)  25.3  Platelet Count (CBC)  35 (Result(s) reported on 07 Jul 2014 at 07:28AM.)  MCV 91  MCH 30.6  MCHC 33.6  RDW  17.2  Segmented Neutrophils 73  Lymphocytes 18  Monocytes 8  Diff Comment 1 ANISOCYTOSIS  Diff Comment 2 POIKILOCYTOSIS  Metamyelocyte 1  Diff Comment 3 PLTS VARIED IN SIZE  Result(s) reported on 07 Jul 2014 at 07:28AM.  30-Sep-15 05:19   WBC (CBC) 5.4  RBC (CBC)  2.86  Hemoglobin (CBC)  8.7  Hematocrit (CBC)  26.1  Platelet Count (CBC)  40 (Result(s) reported on 08 Jul 2014 at 07:51AM.)  MCV 91  MCH 30.4  MCHC 33.3  RDW  17.2  Bands 5  Segmented Neutrophils 77  Lymphocytes 13  Monocytes 4  Myelocyte 1  NRBC 1  Diff Comment 1 ANISOCYTOSIS  Diff Comment 2 PLTS VARIED IN SIZE  Result(s) reported on 08 Jul 2014 at 07:51AM.   PERTINENT RADIOLOGY STUDIES: LabUnknown:    26-Sep-15 13:25, CT Maxillofacial Area Without Contrast  PACS Image   CT:  CT Maxillofacial Area Without Contrast   REASON FOR EXAM:    bilateral jaw pain after recently receiving zometa   which can cause jaw osteonecr  COMMENTS:       PROCEDURE: CT  - CT MAXILLOFACIAL AREA WO  - Jul 04 2014  1:25PM     CLINICAL DATA:  Bilateral jaw pain.  History of multiple myeloma.    EXAM:  CT MAXILLOFACIAL WITHOUT CONTRAST    TECHNIQUE:  Multidetector CT imaging of the maxillofacial structures was  performed. Multiplanar CT image reconstructions were also generated.  A small metallic BB was placed on the right temple in order to  reliably differentiate right from left.    COMPARISON:  None.    FINDINGS:  Normal appearance of the visualized intracranial structures. Both  globes are intact. Right thyroid nodule measuring up to 1.4 cm.  No  significant soft tissue swelling in the neck or face. Both  mandibular condyles are located. No significant edema around the  mandibular condyles. Normal appearance of the parotid glands  bilaterally. Degenerative facet disease in the cervical spine. Disc  space narrowing at C5-C6.There is some lucency involving the bony  structures which may be related to multiple myeloma.    There is extensive mucosal disease in the left maxillary sinus. No  evidence for bony destruction in the left maxillary sinus walls. The  other paranasal sinuses are clear. Mastoid air cells are clear.     IMPRESSION:  No acute bone abnormality in the face. If there is high clinical  concern for osteonecrosis in the jaw, consider further evaluation  with MRI.    Extensive mucosal disease in the left maxillary sinus. Findings  could represent acute sinusitis.      Electronically Signed    By: Markus Daft M.D.    On: 07/04/2014 15:17     Verified By: ADAM R.  HENN, M.D.,   Pertinent Past History:  Pertinent Past History PFSH: ?? Comments No family history of colorectal cancer, breast cancer, or ovarian cancer.   history of brain cancer in the family ?? Comments does not smoke.  Does not drink. ?? Additional Past Medical and Surgical History history of hypertension, anemia, renal failure,   Hospital Course:  Hospital Course To hospital stay following problems have been evaluated and treated 1.  Acute renal failure  with underlying chronic renal failure Patient received IV hydration and treatment of hypercalcemia and gradually serum creatinine decreased from more than 5-0   to   2.91  Patient was followed by nephrologist during hospital stay and dialysis was not indicated 2.  Hypercalcemia secondary to dehydration and multiple myeloma. Patient was treated with the 2 mg of Zometa.  Patient responded with calcium gradually decreasing. 3.  Anemia due to multiple factors including multiple myeloma and renal  failure were Patient was receiving Procrit but received 1 unit of packed cell transfusion 4.  Thrombocytopenia most likely secondary to involvement of bone marrow with multiple myeloma 5.  Patient developed atypical chest pain with a negative troponin and negative cardiac workup.  Patient was monitored and no cardiac arrhythmias detected. Patient was discharged with advice to get followup continue her on prednisone. Possibility of further chemotherapy for the treatment of multiple myeloma would be considered.   Condition on Discharge Satisfactory   Code Status:  Code Status Full Code   DISCHARGE INSTRUCTIONS HOME MEDS:  Medication Reconciliation: Patient's Home Medications at Discharge:     Medication Instructions  amlodipine 10 mg oral tablet  1 tab(s) orally once a day   metoprolol tartrate 50 mg oral tablet  1 tab(s) orally once a day x 30 days   enalapril 10 mg oral tablet  1 tab(s) orally 2 times a day   centrum silver therapeutic multiple vitamins with minerals oral tablet  1 tab(s) orally once a day   vitamin d3  1 cap(s) orally once a day   prilosec 20 mg oral delayed release capsule  1 cap(s) orally 2 times a day   hydralazine 25 mg oral tablet  1 tab(s) orally 3 times a day   biotin 5000 mcg oral tablet, disintegrating  1 tab(s) orally once a day   acetaminophen 325 mg oral tablet  2 tab(s) orally every 6 hours, As needed, pain or temp. greater than 100.4   prednisone 20 mg oral tablet  3 tab(s) orally once a day   prednisone 20 mg oral tablet  3 tab(s) orally once a day    PRESCRIPTIONS: ELECTRONICALLY SUBMITTED  STOP TAKING THE FOLLOWING MEDICATION(S):    decadron: 20 milligram(s) orally once a week  Physician's Instructions:  Home Health? No   Treatments None   Home Oxygen? No   Diet Regular   Dietary Supplements None   Activity Limitations None   Referrals None   Return to Work Not Applicable   Time frame for Follow Up Appointment 1-2 days   Other  Comments Monday to see me in my office   Electronic Signatures: Encarnacion Scioneaux, Martie Lee (MD)  (Signed 30-Sep-15 15:00)  Authored: ADMISSION DATE AND DIAGNOSIS, CHIEF COMPLAINT/HPI, Allergies, PERTINENT LABS, PERTINENT RADIOLOGY STUDIES, PERTINENT PAST [place] MEDS, PATIENT INSTRUCTIONS   Last Updated: 30-Sep-15 15:00 by Jobe Gibbon (MD)

## 2015-01-30 NOTE — Discharge Summary (Signed)
Dates of Admission and Diagnosis:  Date of Admission 07-Aug-2014   Date of Discharge 11-Aug-2014   Admitting Diagnosis acute renal failure   Final Diagnosis acute on chronic renal failure   Discharge Diagnosis 1 multiple myeloma progressing   2 anemia secondary to multiple myeloma   3 hypercalcemia    Chief Complaint/History of Present Illness 1. Multiple myeloma(diagnosis in August of 2012) Anemia.  Renal failure.  60% plasma cells in bone marrow. Monoclonal gammopathy. Result: Abnormal Myeloma FISH Profile Monosomy for chromosome 13 Loss/deletion of MAF DNA sequence Gain of chromosome 9 CD56+ monoclonal plasma cell population (14%), consistent with pasmacell neoplasm  skeletal survey showing osteopenia 3. Started on chemotherapy with Velcade, Decadron, or Revlamid  Zometa will be added when, kidney function improves from August 17 to September 14 of 2012 Patient was hospitalized with renal failure or and patient did not want to continue her   revlamid was discontinued (September 15 2 September 28 of 2012-hospitalization) 4..patient continued Velcade and Decadron from 06/23/2011   to  27 /11/2010 5.CY-BOR -D started on Feb 20, 2012, July 02, 2012. 6.POMOLIDOMIDE AND DECADRON February 05, 2013-May 20, 2013. 7.  Velcade maintenance from November, 2014 January of 2015. 8.  kuporilis , Cytoxan in February of 2015. 9.Cytoxan was discontinued in    may 89f2015  because of increasing myelosuppression supportive therapy:  PROCRIT  AND packed cell transfusion 10progressive  disease by rising   haracterised by rising light chain progressive anemia and progressive renal failure patient started on Doxil and Velcade May 13, 2014 palliative Radiation therapy to the spine in August of 2015   Chief Complaint/History of Present Illness cont'd Patient was admitted in the hospital with progressing renal failure.  Increasing shortness of breath.  Anemia.  Declining performance status  frequent fall.   Allergies:  Sulfa drugs: Hives  Routine BB:  02-Nov-15 13:37   ABO Group + Rh Type B Positive  Antibody Screen NEGATIVE (Result(s) reported on 10 Aug 2014 at 02:55PM.)  Crossmatch Unit 1 Transfused  Result(s) reported on 11 Aug 2014 at [facility]  Routine Chem:  31-Oct-15 06:15   Glucose, Serum  139  BUN  94  Creatinine (comp)  6.76  Sodium, Serum 136  Potassium, Serum  5.7  Chloride, Serum  111  CO2, Serum  13  Calcium (Total), Serum  7.5  Anion Gap 12  Osmolality (calc) 303  eGFR (African American)  8  eGFR (Non-African American)  6 (eGFR values <610mmin/1.73 m2 may be an indication of chronic kidney disease (CKD). Calculated eGFR, using the MRDR Study equation, is useful in  patients with stable renal function. The eGFR calculation will not be reliable in acutely ill patients when serum creatinine is changing rapidly. It is not useful in patients on dialysis. The eGFR calculation may not be applicable to patients at the low and high extremes of body sizes, pregnant women, and vegetarians.)  01-Nov-15 05:32   Potassium, Serum 4.1 (Result(s) reported on 09 Aug 2014 at 06[facility]  02-Nov-15 05:42   Glucose, Serum  118  BUN  95  Creatinine (comp)  5.44  Sodium, Serum 145  Potassium, Serum 3.7  Chloride, Serum  113  CO2, Serum  13  Calcium (Total), Serum  6.6  Anion Gap  19  Osmolality (calc) 319  eGFR (African American)  10  eGFR (Non-African American)  8 (eGFR values <6061min/1.73 m2 may be an indication of chronic kidney disease (CKD). Calculated eGFR, using the MRDR Study equation, is useful  in  patients with stable renal function. The eGFR calculation will not be reliable in acutely ill patients when serum creatinine is changing rapidly. It is not useful in patients on dialysis. The eGFR calculation may not be applicable to patients at the low and high extremes of body sizes, pregnant women, and vegetarians.)  Result Comment CALCIUM - RESULTS  VERIFIED BY REPEAT TESTING.  - NOTIFIED OF CRITICAL VALUE  - C/MELISSA COBB AT 8032 ON 08/10/14 [facility]  - READ-BACK PROCESS PERFORMED.  Result(s) reported on 10 Aug 2014 at 06:35AM.  Routine Hem:  02-Nov-15 05:42   Hemoglobin (CBC)  6.8 (Result(s) reported on 10 Aug 2014 at 06:34AM.)   Pertinent Past History:  Pertinent Past History has been recorded in multiple previous admitting note   Hospital Course:  Hospital Course during hospital stayPatient had a creatinine of 7.  Hemoglobin was low.  Patient has increasing shortness of breath.  Careful hydration was started.  Patient received packed cell transfusion.  There was no significant improvement in patient's general condition intravenous steroid was started.  After prolonged discussion with family included daughters and children was decided that patient is not going to undergo any dialysis.  Options for treatment of multiple myeloma that becoming limited. Patient was then discharged hospice home for end-of-life care.   Condition on Discharge Critical   DISCHARGE INSTRUCTIONS HOME MEDS:  Medication Reconciliation: Patient's Home Medications at Discharge:     Medication Instructions  amlodipine 10 mg oral tablet  1 tab(s) orally once a day   metoprolol tartrate 50 mg oral tablet  1 tab(s) orally once a day x 30 days   enalapril 10 mg oral tablet  1 tab(s) orally 2 times a day   duragesic-25 25 mcg/hr transdermal film, extended release  1 patch transdermal every 72 hours   acetaminophen-hydrocodone 325 mg-5 mg oral tablet  1 tab(s) orally 1 to 3 times a day Take one every 4-6 hours  as needed - for Pain   prednisone 20 mg oral tablet  1 tab(s) orally once a day   alprazolam 0.25 mg oral tablet  1 tab(s) orally every 8 hours, As needed, anxiety   pantoprazole 40 mg oral delayed release tablet  1 tab(s) orally 2 times a day   levofloxacin 250 mg oral tablet  1 tab(s) orally every 48 hours   morphine 20 mg/ml oral concentrate  ****HOSPICE  PATIENT****- 1 milliliter(s) orally every 1 - 2 hours, As Needed - for Pain - for Shortness of Breath      Physician's Instructions:  Home Health? No   Treatments None   Home Oxygen? Yes   Oxygen delivery at home: 2L  Nasal Cannula   Diet Regular   Dietary Supplements None   Diet Consistency Regular Consistency   Activity Limitations None   Return to Work Not Applicable   Time frame for Follow Up Appointment 2-4 weeks   Electronic Signatures: Jacora Hopkins, Martie Lee (MD)  (Signed [phone] 08:18)  Authored: ADMISSION DATE AND DIAGNOSIS, CHIEF COMPLAINT/HPI, Allergies, PERTINENT LABS, PERTINENT PAST [place] MEDS, PATIENT INSTRUCTIONS   Last Updated: 16-Nov-15 08:18 by Jobe Gibbon (MD)

## 2015-01-30 NOTE — Consult Note (Signed)
Brief Consult Note: Diagnosis: atypical chest pain.   Patient was seen by consultant.   Consult note dictated.   Orders entered.   Discussed with Attending MD.   Comments: 89yCF pmh MM and HTN admitted 9-24 for renal failure associated with MM - essentially reeiving IVf hydration with some initial mioprovement in renal function. 07-03-14 Rapid response called for "chest pain" thus medice asked for consultation  states largest complaint is inability to sleep and jittery sensation, however had brief episode feeling jaw pain - described as tightness, radiation of pain to left shoulder as well. chest pain at this time. had nitro tab - symptoms resolved  1. atypical chest pain: manifesting as jaw/left shoulder and possible chest pain: brief episode -- has cardiac risk factors - htn, age, otherwise minimal risks.  regardless initial EKG reveals sinus tach and RBBB, place on tele, trend CEx3. will also dose ASA now. -- follow CE trend, if indeed has elevated troponins active symptoms, hgb goal will be 10, however no indacation for transfusion at this point  2. Multiple myeloma- per primary 3. aki: per nephro -- initial improvemnt with IVf 4.  HTN: poorly contolled - continue home meds - add prn hydralazine -- may require another po agent if not well controlled 5. vte px: defer to primary, currently ted/scd full 31mn [id number].  Electronic Signatures: Hower, DAaron Mose(MD)  (Signed 25-Sep-15 23:59)  Authored: Brief Consult Note   Last Updated: 25-Sep-15 23:59 by HLytle Butte(MD)

## 2015-01-30 NOTE — Consult Note (Signed)
PATIENT NAME:  Stefanie Bradford, SENSENEY MR#:  [id number] DATE OF BIRTH:  10-06-1934  DATE OF CONSULTATION:  07/03/2014  REFERRING PHYSICIAN:  Delorise Shiner K. Choksi, MD  CONSULTING PHYSICIAN:  Aaron Mose. Hower, MD  PRIMARY CARE PHYSICIAN: Nonlocal.    REASON FOR CONSULT:  Chest pain.    HISTORY OF PRESENT ILLNESS: This is a 81 year old Caucasian female with past medical history of hypertension as well as multiple myeloma, currently undergoing active treatment with chemotherapy as well as radiation, was originally admitted to the hospital on 09/24 for renal failure associated with her multiple myeloma.  Essentially thus far in her hospital course she has been receiving IV fluid hydration with some initial improvement of her renal function, however, on 07/03/2014 a rapid response was called for "chest pain". Thus medicine has been asked for a consultation. Essentially her largest complaint at this time is the inability to sleep and a jittery sensation, however, she did describe a brief episode where she was feeling a sensation of jaw pain described mainly as tightness, intensity 6 to 7 out of 10, radiation of the pain to her left shoulder as well as her chest.  Though it seems that the initiating location was actually her jaw. She had no associated symptoms including shortness of breath, nausea or diaphoresis. She did receive a nitroglycerin tablet and symptoms have resolved at this point. Currently she is chest pain, jaw pain and shoulder pain free. No further complaints at this time.   REVIEW OF SYSTEMS:   CONSTITUTIONAL: Denies fever, fatigue, weakness.  EYES: Denies blurred vision, double vision or eye pain.   EARS, NOSE AND THROAT: Denies tinnitus, ear pain or hearing loss, however, she is chronically hard of hearing.  RESPIRATORY: Denies cough, wheeze, shortness of breath.  CARDIOVASCULAR: Positive for chest pain as described above. However, denies orthopnea, palpitations or edema.   GASTROINTESTINAL: Denies  nausea, vomiting, diarrhea, abdominal pain.  GENITOURINARY: Denies dysuria, hematuria.   ENDOCRINE: Denies nocturia or thyroid problems.   HEMATOLOGY/LYMPHATICS: Denies easy bruising or bleeding.   SKIN:  Denies rash or lesion.  MUSCULOSKELETAL:  Denies pain in neck, back, shoulder, knees, hips or arthritic symptoms.  NEUROLOGIC: Denies paralysis, paresthesias.  PSYCHIATRIC: Denies anxiety or depressive symptoms.    Otherwise a full review of systems was performed by me and is negative.   PAST MEDICAL HISTORY: Consistent with multiple myeloma, on active chemotherapy and radiation per oncology, as well as history of hypertension.   SOCIAL HISTORY: Denies any alcohol, tobacco or drug use.   FAMILY HISTORY: Denies any cardiovascular disorder to her knowledge.   ALLERGIES: SULFA DRUGS.   HOME MEDICATIONS: Include Decadron 20 mg p.o. q. weekly, Tylenol 500 mg 2 tabs p.o. q.6 hours as needed for pain, enalapril 10 mg p.o. b.i.d., metoprolol 50 mg p.o. daily, Norvasc 10 mg p.o. daily, Prilosec 20 mg p.o. b.i.d., hydralazine 25 mg p.o. 3 times daily, multivitamin 1 tab p.o. daily, biotin 5000 mcg p.o. daily, vitamin D3 one tab p.o. daily.    PHYSICAL EXAMINATION:  VITAL SIGNS: Temperature 98, heart rate 91, respirations 20, blood pressure ranging from 120/64 to 190/88, saturating 98% on room air. , BMI 31.1.  GENERAL: Obese Caucasian female currently in no acute distress.  HEAD: Normocephalic, atraumatic.  EYES: Pupils equal, round, react to light. Extraocular muscles intact.  No scleral icterus. Mouth:  Moist mucous membranes.  Dentition intact.  No abscess noted.   EARS, NOSE AND THROAT: Clear without exudates.  No external lesions.   NECK:  Supple. No thyromegaly. No nodules. No JVD.  PULMONARY: Clear to auscultation bilaterally without wheeze, rale or rhonchi.  No use of accessory muscles.  Good respiratory effort.   CHEST:  Nontender to palpation.  CARDIOVASCULAR: S1, S2, tachycardic with a  2/6 systolic ejection murmur best heard at right upper sternal border. Pedal pulses 2+ bilaterally. No edema.  GASTROINTESTINAL: Soft, nontender, nondistended. No masses. Positive bowel sounds. No hepatosplenomegaly.  MUSCULOSKELETAL: No swelling, clubbing or edema. Range of motion full in all extremities.  NEUROLOGIC: Cranial nerves II through XII intact.  No gross  focal neurologic deficit.  Sensation intact.  Reflexes intact.   SKIN:   No ulceration, lesion, rash, or cyanosis. Skin warm and dry.  Turgor intact.   PSYCHIATRIC:  Mood and affect within normal limits.  The patient is awake, alert, oriented x3.  Insight, judgment intact.   LABORATORY DATA: EKG performed during rapid response at bedtime reveals sinus tachycardia at 106 with evidence of right bundle branch block which is old. No ST or T wave abnormalities.   Remainder of laboratory data: Sodium 138, potassium 4.2, chloride 108, bicarb 18, anion gap of 12, BUN 63, creatinine 5.04, glucose 173. LFTs: Total protein 8.3, albumin 3.2, bilirubin 0.4, alkaline phosphatase 203, AST 69, ALT 105. WBC 7.5, hemoglobin 7.5, platelets of 36, MCV of 92, RDW of 18.7.   ASSESSMENT AND PLAN: A 81 year old Caucasian female with history of multiple myeloma as well as hypertension who was originally admitted on 09/24 for renal failure associated with multiple myeloma. She is being followed by the oncology service as well as nephrology.  She had an episode of jaw pain with radiation to the chest and the left shoulder, being asked for a medical consult after rapid response.  1.  Atypical chest pain manifesting as jaw and left shoulder pain with potential chest pain, which was a brief episode. She does have cardiac risk factors including hypertension and age, otherwise, minimal risk factors. Regardless, her initial EKG reveals sinus tachycardia with right bundle branch block, which was old. We will place her on telemetry. Trend cardiac enzymes x3. We are also  dosing aspirin 324 mg now.  Follow cardiac enzymes trend. If indeed troponins are elevated she can be started on heparin drip and at that time we will get a cardiology consult if required. For now we will hold off on both of those measures and also if it turns out that she is having active heart symptoms and she remains symptomatic, her hemoglobin goal hemoglobin greater than 10; however, there is no indication for transfusion at this time.  2.  Multiple myeloma. Care per primary service, oncology.  3.  Acute kidney injury. Care for nephrology with some initial improvement with IV fluid hydration.  4.  Hypertension, poorly controlled. Continue her home medication. We will also add p.r.n. hydralazine 10 mg IV q. 4 hours  as needed for systolic blood pressure greater than 786, diastolic greater than 754. She may require another p.o. agent if she is not well controlled on these measures.  5.  Venous thromboembolic prophylaxis.  We will defer to primary service.  Currently, she is on  TEDs and SCDs.    CODE STATUS: The patient is a full code.   TIME SPENT: 45 minutes.  ____________________________ Aaron Mose. Hower, MD dkh:AT D: 07/03/2014 49:20:10 ET T: 07/04/2014 00:31:47 ET JOB#: [id number]  cc: Aaron Mose. Hower, MD, <Dictator> DAVID Woodfin Ganja MD ELECTRONICALLY SIGNED 07/04/2014 21:08

## 2015-07-29 IMAGING — CT CT MAXILLOFACIAL WITHOUT CONTRAST
3 series · 16 of 47 positions shown, 19 images · non-contrast
Comparison: None.

CLINICAL DATA: Bilateral jaw pain.  History of multiple myeloma.

EXAM:
CT MAXILLOFACIAL WITHOUT CONTRAST
TECHNIQUE: Multidetector CT imaging of the maxillofacial structures was
performed. Multiplanar CT image reconstructions were also generated.
A small metallic BB was placed on the right temple in order to
reliably differentiate right from left.

[Series 2: max soft · axial · 0.31mm/px · z∈[-222,-96]mm · 10 of 75 slices shown, 13 images]
[im 6/75  brain]
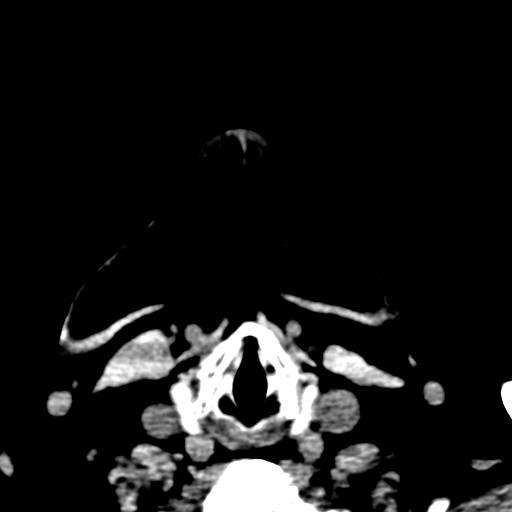
[im 6/75  bone]
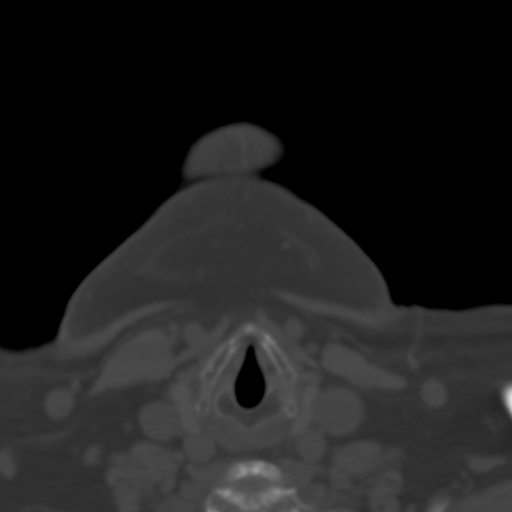
[im 13/75  bone]
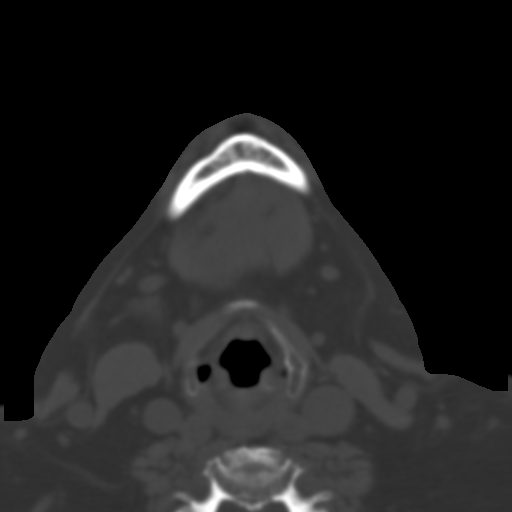
[im 21/75  bone]
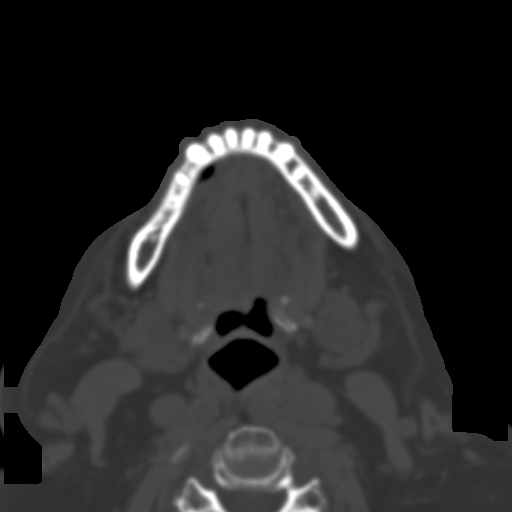
[im 26/75  bone]
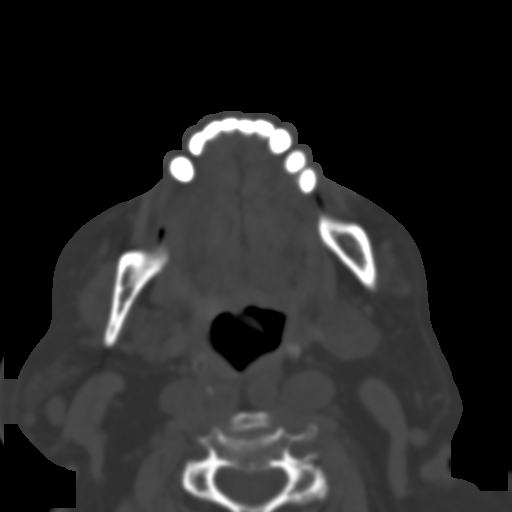
[im 34/75  brain]
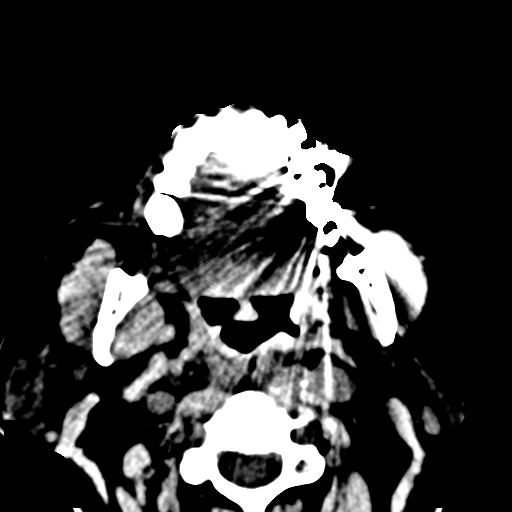
[im 34/75  bone]
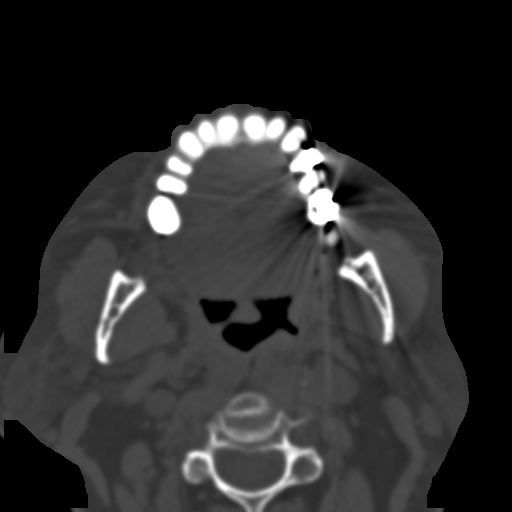
[im 41/75  bone]
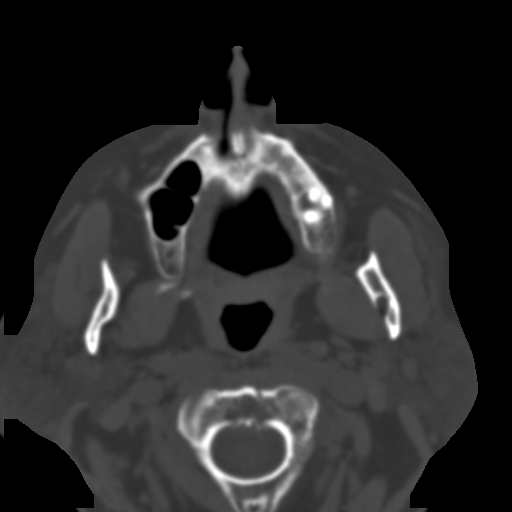
[im 49/75  bone]
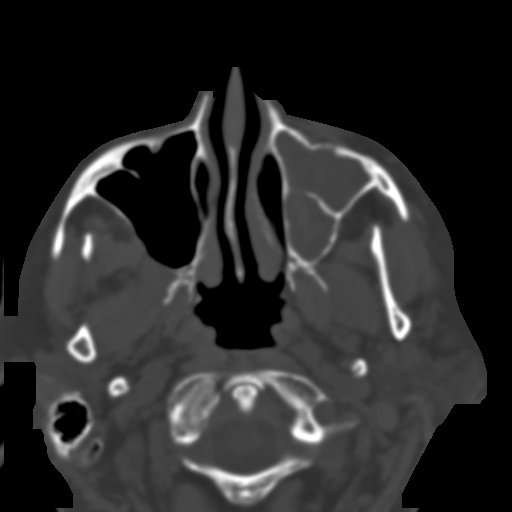
[im 57/75  bone]
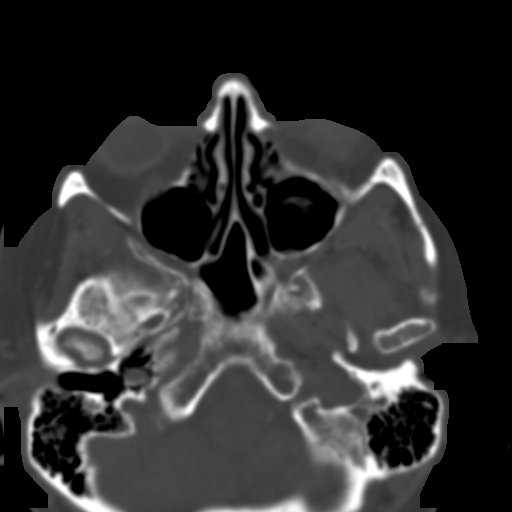
[im 62/75  brain]
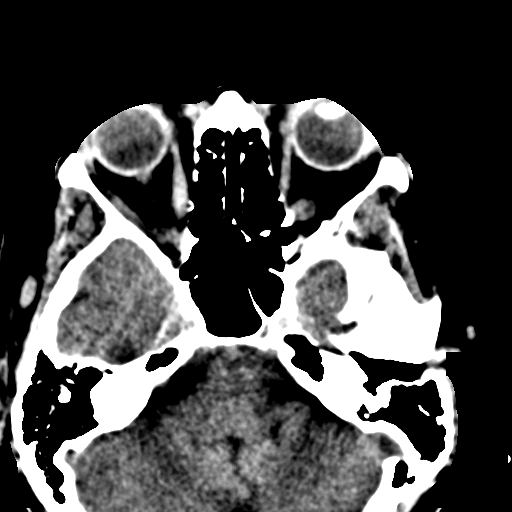
[im 62/75  bone]
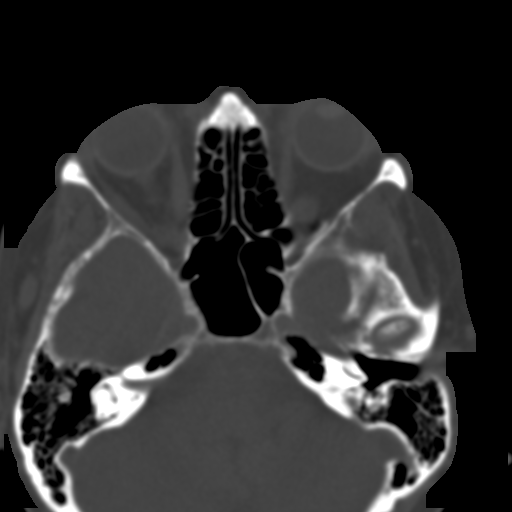
[im 69/75  bone]
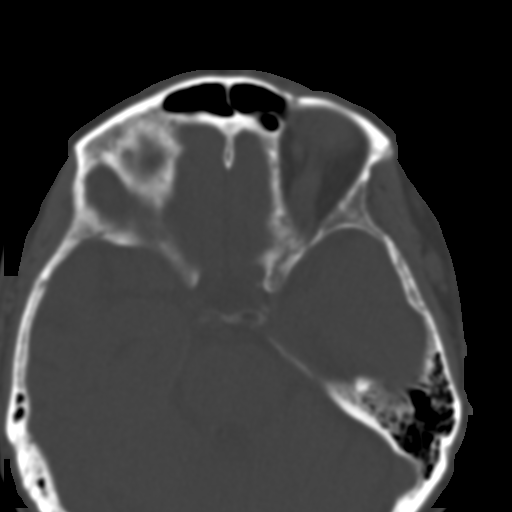

[Series 4: coronal soft · coronal · 0.31mm/px · 3 of 80 slices shown]
[im 27/80  bone]
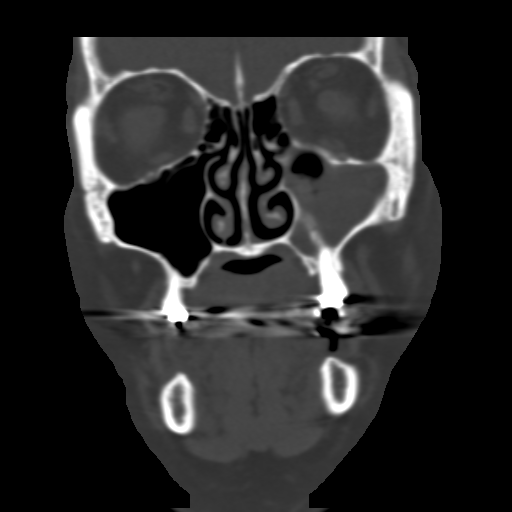
[im 36/80  bone]
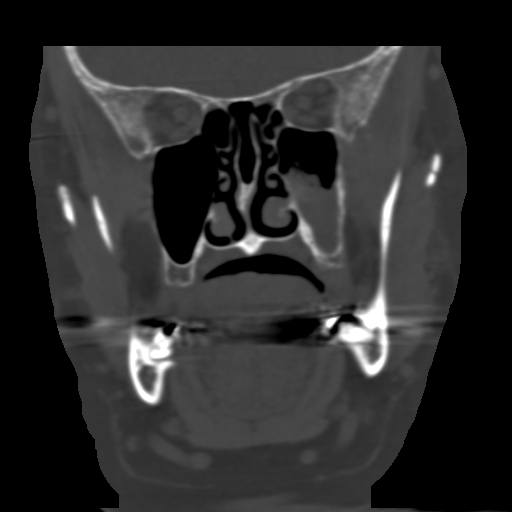
[im 44/80  bone]
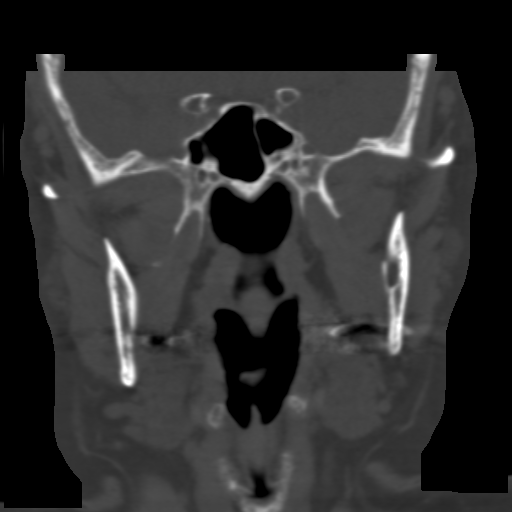

[Series 5: sagittal soft · sagittal · 0.31mm/px · 3 of 81 slices shown]
[im 27/81  bone]
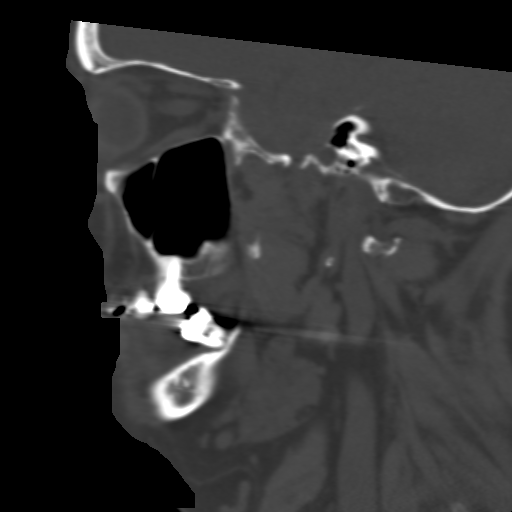
[im 41/81  bone]
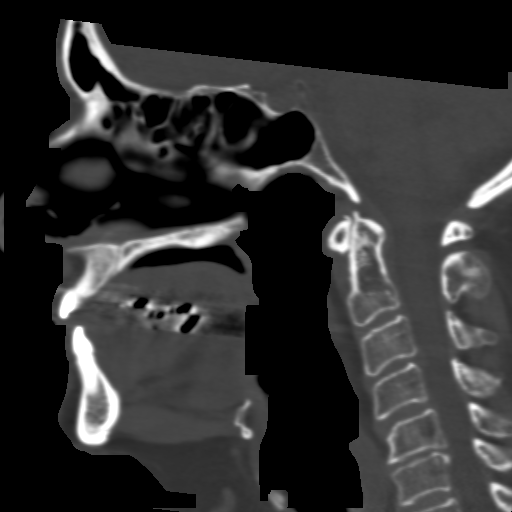
[im 54/81  bone]
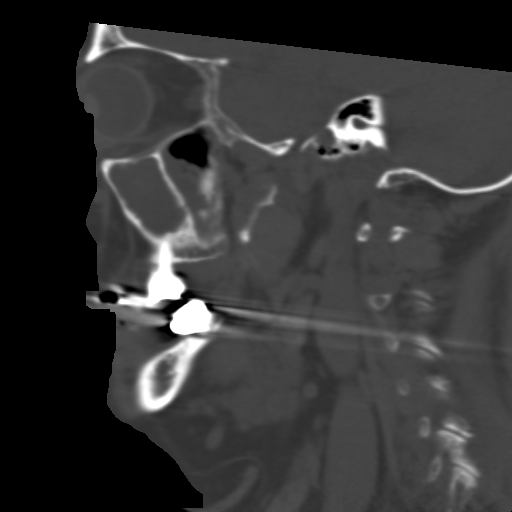

[16 of 47 positions shown; findings below may reference images not displayed]

FINDINGS: Normal appearance of the visualized intracranial structures. Both
globes are intact. Right thyroid nodule measuring up to 1.4 cm. No
significant soft tissue swelling in the neck or face. Both
mandibular condyles are located. No significant edema around the
mandibular condyles. Normal appearance of the parotid glands
bilaterally. Degenerative facet disease in the cervical spine. Disc
space narrowing at C5-C6. There is some lucency involving the bony
structures which may be related to multiple myeloma.

There is extensive mucosal disease in the left maxillary sinus. No
evidence for bony destruction in the left maxillary sinus walls. The
other paranasal sinuses are clear. Mastoid air cells are clear.
IMPRESSION: No acute bone abnormality in the face. If there is high clinical
concern for osteonecrosis in the jaw, consider further evaluation
with MRI.

Extensive mucosal disease in the left maxillary sinus. Findings
could represent acute sinusitis.
